# Patient Record
Sex: Female | Born: 1975 | Race: White | Hispanic: No | Marital: Single | Smoking: Never smoker
Health system: Southern US, Community
[De-identification: ages and names within clinical notes are randomized; demographics above are authoritative.]

## PROBLEM LIST (undated history)

## (undated) DIAGNOSIS — Z87442 Personal history of urinary calculi: Secondary | ICD-10-CM

## (undated) DIAGNOSIS — J342 Deviated nasal septum: Secondary | ICD-10-CM

## (undated) DIAGNOSIS — R569 Unspecified convulsions: Secondary | ICD-10-CM

## (undated) DIAGNOSIS — J329 Chronic sinusitis, unspecified: Secondary | ICD-10-CM

## (undated) HISTORY — PX: WISDOM TOOTH EXTRACTION: SHX21

## (undated) HISTORY — DX: Unspecified convulsions: R56.9

---

## 1999-06-28 ENCOUNTER — Other Ambulatory Visit: Admission: RE | Admit: 1999-06-28 | Discharge: 1999-06-28 | Payer: Self-pay | Admitting: Family Medicine

## 2000-12-04 ENCOUNTER — Other Ambulatory Visit: Admission: RE | Admit: 2000-12-04 | Discharge: 2000-12-04 | Payer: Self-pay | Admitting: Family Medicine

## 2001-12-17 ENCOUNTER — Other Ambulatory Visit: Admission: RE | Admit: 2001-12-17 | Discharge: 2001-12-17 | Payer: Self-pay | Admitting: Family Medicine

## 2002-12-25 ENCOUNTER — Encounter: Admission: RE | Admit: 2002-12-25 | Discharge: 2002-12-25 | Payer: Self-pay | Admitting: Family Medicine

## 2002-12-25 ENCOUNTER — Encounter: Payer: Self-pay | Admitting: Family Medicine

## 2004-02-04 ENCOUNTER — Other Ambulatory Visit: Admission: RE | Admit: 2004-02-04 | Discharge: 2004-02-04 | Payer: Self-pay | Admitting: Unknown Physician Specialty

## 2004-11-16 ENCOUNTER — Encounter: Admission: RE | Admit: 2004-11-16 | Discharge: 2004-11-16 | Payer: Self-pay | Admitting: Family Medicine

## 2005-02-28 ENCOUNTER — Other Ambulatory Visit: Admission: RE | Admit: 2005-02-28 | Discharge: 2005-02-28 | Payer: Self-pay | Admitting: Family Medicine

## 2006-02-21 ENCOUNTER — Other Ambulatory Visit: Admission: RE | Admit: 2006-02-21 | Discharge: 2006-02-21 | Payer: Self-pay | Admitting: Family Medicine

## 2008-06-21 ENCOUNTER — Other Ambulatory Visit: Admission: RE | Admit: 2008-06-21 | Discharge: 2008-06-21 | Payer: Self-pay | Admitting: Family Medicine

## 2008-06-28 ENCOUNTER — Encounter: Admission: RE | Admit: 2008-06-28 | Discharge: 2008-06-28 | Payer: Self-pay | Admitting: Family Medicine

## 2009-11-08 ENCOUNTER — Other Ambulatory Visit: Admission: RE | Admit: 2009-11-08 | Discharge: 2009-11-08 | Payer: Self-pay | Admitting: Family Medicine

## 2012-07-25 ENCOUNTER — Other Ambulatory Visit: Payer: Self-pay | Admitting: Family Medicine

## 2012-07-25 ENCOUNTER — Ambulatory Visit
Admission: RE | Admit: 2012-07-25 | Discharge: 2012-07-25 | Disposition: A | Discharge status: Home / Self Care | Payer: BC Managed Care – PPO | Source: Ambulatory Visit | Attending: Family Medicine | Admitting: Family Medicine

## 2012-07-25 DIAGNOSIS — R109 Unspecified abdominal pain: Secondary | ICD-10-CM

## 2012-08-01 ENCOUNTER — Other Ambulatory Visit (HOSPITAL_COMMUNITY): Payer: Self-pay | Admitting: Urology

## 2012-08-01 DIAGNOSIS — R109 Unspecified abdominal pain: Secondary | ICD-10-CM

## 2012-08-01 DIAGNOSIS — N1330 Unspecified hydronephrosis: Secondary | ICD-10-CM

## 2012-08-08 ENCOUNTER — Encounter (HOSPITAL_COMMUNITY): Payer: Self-pay

## 2012-08-08 ENCOUNTER — Encounter (HOSPITAL_COMMUNITY)
Admission: RE | Admit: 2012-08-08 | Discharge: 2012-08-08 | Disposition: A | Discharge status: Home / Self Care | Payer: BC Managed Care – PPO | Source: Ambulatory Visit | Attending: Urology | Admitting: Urology

## 2012-08-08 DIAGNOSIS — N1330 Unspecified hydronephrosis: Secondary | ICD-10-CM

## 2012-08-08 DIAGNOSIS — R109 Unspecified abdominal pain: Secondary | ICD-10-CM | POA: Insufficient documentation

## 2012-08-08 MED ORDER — FUROSEMIDE 10 MG/ML IJ SOLN
54.0000 mg | Freq: Once | INTRAMUSCULAR | Status: AC
  Administered 2012-08-08: 54 mg via INTRAVENOUS
  Filled 2012-08-08: qty 4

## 2012-08-08 MED ORDER — TECHNETIUM TC 99M MERTIATIDE
15.0000 | Freq: Once | INTRAVENOUS | Status: AC | PRN
  Administered 2012-08-08: 15 via INTRAVENOUS

## 2012-11-17 ENCOUNTER — Other Ambulatory Visit (HOSPITAL_COMMUNITY)
Admission: RE | Admit: 2012-11-17 | Discharge: 2012-11-17 | Disposition: A | Discharge status: Home / Self Care | Payer: BC Managed Care – PPO | Source: Ambulatory Visit | Attending: Family Medicine | Admitting: Family Medicine

## 2012-11-17 ENCOUNTER — Other Ambulatory Visit: Payer: Self-pay | Admitting: Family Medicine

## 2012-11-17 DIAGNOSIS — Z01419 Encounter for gynecological examination (general) (routine) without abnormal findings: Secondary | ICD-10-CM | POA: Insufficient documentation

## 2012-11-17 DIAGNOSIS — Z1151 Encounter for screening for human papillomavirus (HPV): Secondary | ICD-10-CM | POA: Insufficient documentation

## 2013-01-12 ENCOUNTER — Other Ambulatory Visit: Payer: Self-pay | Admitting: Family Medicine

## 2013-07-17 ENCOUNTER — Other Ambulatory Visit (HOSPITAL_COMMUNITY): Payer: Self-pay | Admitting: Urology

## 2013-07-17 DIAGNOSIS — N1330 Unspecified hydronephrosis: Secondary | ICD-10-CM

## 2013-09-04 ENCOUNTER — Ambulatory Visit (HOSPITAL_COMMUNITY)
Admission: RE | Admit: 2013-09-04 | Discharge: 2013-09-04 | Disposition: A | Discharge status: Home / Self Care | Payer: BC Managed Care – PPO | Source: Ambulatory Visit | Attending: Urology | Admitting: Urology

## 2013-09-04 DIAGNOSIS — N259 Disorder resulting from impaired renal tubular function, unspecified: Secondary | ICD-10-CM | POA: Insufficient documentation

## 2013-09-04 DIAGNOSIS — N1330 Unspecified hydronephrosis: Secondary | ICD-10-CM

## 2013-09-04 MED ORDER — FUROSEMIDE 10 MG/ML IJ SOLN
54.0000 mg | Freq: Once | INTRAMUSCULAR | Status: AC
  Administered 2013-09-04: 54 mg via INTRAVENOUS
  Filled 2013-09-04: qty 6

## 2013-09-04 MED ORDER — TECHNETIUM TC 99M MERTIATIDE
15.7000 | Freq: Once | INTRAVENOUS | Status: AC | PRN
  Administered 2013-09-04: 16 via INTRAVENOUS

## 2013-12-15 ENCOUNTER — Telehealth: Payer: Self-pay | Admitting: Hematology and Oncology

## 2013-12-15 NOTE — Telephone Encounter (Signed)
Left message for patient and gave np appt for 08/25 @ 1:30 w/Dr. Bertis RuddyGorsuch.  Referring Dr.  Gweneth DimitriWendy [name] Dx- Leukocytosis.

## 2013-12-22 ENCOUNTER — Encounter: Payer: Self-pay | Admitting: Hematology and Oncology

## 2013-12-22 ENCOUNTER — Encounter (INDEPENDENT_AMBULATORY_CARE_PROVIDER_SITE_OTHER): Payer: Self-pay

## 2013-12-22 ENCOUNTER — Ambulatory Visit (HOSPITAL_BASED_OUTPATIENT_CLINIC_OR_DEPARTMENT_OTHER): Payer: BC Managed Care – PPO | Admitting: Hematology and Oncology

## 2013-12-22 ENCOUNTER — Telehealth: Payer: Self-pay | Admitting: Hematology and Oncology

## 2013-12-22 ENCOUNTER — Ambulatory Visit: Payer: BC Managed Care – PPO

## 2013-12-22 VITALS — BP 155/104 | HR 140 | Temp 98.7°F | Resp 20 | Ht 67.0 in | Wt 237.1 lb

## 2013-12-22 DIAGNOSIS — D72829 Elevated white blood cell count, unspecified: Secondary | ICD-10-CM

## 2013-12-22 DIAGNOSIS — B372 Candidiasis of skin and nail: Secondary | ICD-10-CM

## 2013-12-22 NOTE — Progress Notes (Signed)
Checked in new pt with no financial concerns. °

## 2013-12-22 NOTE — Telephone Encounter (Signed)
Pt confirmed labs/ov per 08/25 POF, gave pt AVS....KJ °

## 2013-12-22 NOTE — Assessment & Plan Note (Signed)
The cause of her leukocytosis is likely reactive in nature, related to recurrent skin infection. I recommend she continues her current treatment and I will make her a return appointment in 1 month with repeat blood work, history and physical examination. She agreed with the plan.

## 2013-12-22 NOTE — Progress Notes (Signed)
[facility] CONSULT NOTE  Patient Care Team: Gweneth Dimitri, MD as PCP - General (Family Medicine) Artis Delay, MD as Consulting Physician (Hematology and Oncology)  CHIEF COMPLAINTS/PURPOSE OF CONSULTATION:  Chronic leukocytosis  HISTORY OF PRESENTING ILLNESS:  Jamie Knapp 38 y.o. female is here because of elevated WBC.  She was found to have abnormal CBC from routine blood work monitoring by her PCP. The patient had history of seizure disorder and is on close monitoring with blood work for this. Review of CBC from July 2014 to August 2015 show elevated white blood cell count ranging from 11.9-14. She has recurrent skin infection. She started taking topical antifungal cream for yeast infection. The last prescription antibiotics was more than 3 months ago There is not reported symptoms of sinus congestion, cough, urinary frequency/urgency or dysuria, diarrhea or joint swelling/pain. She had no prior history or diagnosis of cancer. Her age appropriate screening programs are up-to-date. The patient has no prior diagnosis of autoimmune disease and was not prescribed corticosteroids related products.  MEDICAL HISTORY:  Past Medical History  Diagnosis Date  . Seizures   . Leukocytosis, unspecified 12/22/2013    SURGICAL HISTORY: History reviewed. No pertinent past surgical history.  SOCIAL HISTORY: History   Social History  . Marital Status: Single    Spouse Name: N/A    Number of Children: N/A  . Years of Education: N/A   Occupational History  . Not on file.   Social History Main Topics  . Smoking status: Never Smoker   . Smokeless tobacco: Never Used  . Alcohol Use: No  . Drug Use: No  . Sexual Activity: Not on file   Other Topics Concern  . Not on file   Social History Narrative  . No narrative on file    FAMILY HISTORY: Family History  Problem Relation Age of Onset  . Cancer Paternal Grandmother     female cancer    ALLERGIES:  has No Known  Allergies.  MEDICATIONS:  Current Outpatient Prescriptions  Medication Sig Dispense Refill  . levETIRAcetam (KEPPRA) 250 MG tablet       . nystatin cream (MYCOSTATIN)       . VIORELE 0.15-0.02/0.01 MG (21/5) tablet       . zonisamide (ZONEGRAN) 100 MG capsule        No current facility-administered medications for this visit.    REVIEW OF SYSTEMS:   Constitutional: Denies fevers, chills or abnormal night sweats Eyes: Denies blurriness of vision, double vision or watery eyes Ears, nose, mouth, throat, and face: Denies mucositis or sore throat Respiratory: Denies cough, dyspnea or wheezes Cardiovascular: Denies palpitation, chest discomfort or lower extremity swelling Gastrointestinal:  Denies nausea, heartburn or change in bowel habits Lymphatics: Denies new lymphadenopathy or easy bruising Neurological:Denies numbness, tingling or new weaknesses Behavioral/Psych: Mood is stable, no new changes  All other systems were reviewed with the patient and are negative.  PHYSICAL EXAMINATION: ECOG PERFORMANCE STATUS: 0 - Asymptomatic  Filed Vitals:   12/22/13 1340  BP: 155/104  Pulse: 140  Temp: 98.7 F (37.1 C)  Resp: 20   Filed Weights   12/22/13 1340  Weight: 237 lb 1.6 oz (107.548 kg)    GENERAL:alert, no distress and comfortable. She is morbidly obese SKIN: Noted yeast infection on her groin. EYES: normal, conjunctiva are pink and non-injected, sclera clear OROPHARYNX:no exudate, no erythema and lips, buccal mucosa, and tongue normal  NECK: supple, thyroid normal size, non-tender, without nodularity LYMPH:  no palpable  lymphadenopathy in the cervical, axillary or inguinal LUNGS: clear to auscultation and percussion with normal breathing effort HEART: regular rate & rhythm and no murmurs and no lower extremity edema ABDOMEN:abdomen soft, non-tender and normal bowel sounds Musculoskeletal:no cyanosis of digits and no clubbing  PSYCH: alert & oriented x 3 with fluent  speech NEURO: no focal motor/sensory deficits  LABORATORY DATA:  I have reviewed the data as listed ASSESSMENT & PLAN Leukocytosis, unspecified The cause of her leukocytosis is likely reactive in nature, related to recurrent skin infection. I recommend she continues her current treatment and I will make her a return appointment in 1 month with repeat blood work, history and physical examination. She agreed with the plan.  Yeast infection of the skin She is using topical antifungal cream for this. I recommend she continues.

## 2013-12-22 NOTE — Assessment & Plan Note (Signed)
She is using topical antifungal cream for this. I recommend she continues.   

## 2014-01-14 ENCOUNTER — Ambulatory Visit (HOSPITAL_BASED_OUTPATIENT_CLINIC_OR_DEPARTMENT_OTHER): Payer: BC Managed Care – PPO | Admitting: Hematology and Oncology

## 2014-01-14 ENCOUNTER — Encounter: Payer: Self-pay | Admitting: Hematology and Oncology

## 2014-01-14 ENCOUNTER — Other Ambulatory Visit (HOSPITAL_BASED_OUTPATIENT_CLINIC_OR_DEPARTMENT_OTHER): Payer: BC Managed Care – PPO

## 2014-01-14 ENCOUNTER — Telehealth: Payer: Self-pay | Admitting: Hematology and Oncology

## 2014-01-14 VITALS — BP 146/93 | HR 107 | Temp 98.2°F | Resp 21 | Ht 67.0 in | Wt 237.4 lb

## 2014-01-14 DIAGNOSIS — B372 Candidiasis of skin and nail: Secondary | ICD-10-CM

## 2014-01-14 DIAGNOSIS — D72829 Elevated white blood cell count, unspecified: Secondary | ICD-10-CM

## 2014-01-14 LAB — CBC WITH DIFFERENTIAL/PLATELET
BASO%: 0.4 % (ref 0.0–2.0)
BASOS ABS: 0.1 10*3/uL (ref 0.0–0.1)
EOS ABS: 0.2 10*3/uL (ref 0.0–0.5)
EOS%: 1.8 % (ref 0.0–7.0)
HEMATOCRIT: 41.4 % (ref 34.8–46.6)
HEMOGLOBIN: 14.2 g/dL (ref 11.6–15.9)
LYMPH%: 34.2 % (ref 14.0–49.7)
MCH: 30 pg (ref 25.1–34.0)
MCHC: 34.3 g/dL (ref 31.5–36.0)
MCV: 87.3 fL (ref 79.5–101.0)
MONO#: 0.5 10*3/uL (ref 0.1–0.9)
MONO%: 4.4 % (ref 0.0–14.0)
NEUT%: 59.2 % (ref 38.4–76.8)
NEUTROS ABS: 6.9 10*3/uL — AB (ref 1.5–6.5)
NRBC: 0 % (ref 0–0)
PLATELETS: 265 10*3/uL (ref 145–400)
RBC: 4.74 10*6/uL (ref 3.70–5.45)
RDW: 13.1 % (ref 11.2–14.5)
WBC: 11.7 10*3/uL — AB (ref 3.9–10.3)
lymph#: 4 10*3/uL — ABNORMAL HIGH (ref 0.9–3.3)

## 2014-01-14 LAB — MORPHOLOGY
PLT EST: ADEQUATE
RBC Comments: NORMAL

## 2014-01-14 LAB — URINALYSIS, MICROSCOPIC - CHCC
BILIRUBIN (URINE): NEGATIVE
Blood: NEGATIVE
GLUCOSE UR CHCC: NEGATIVE mg/dL
Ketones: NEGATIVE mg/dL
Leukocyte Esterase: NEGATIVE
NITRITE: NEGATIVE
Protein: NEGATIVE mg/dL
SPECIFIC GRAVITY, URINE: 1.025 (ref 1.003–1.035)
UROBILINOGEN UR: 0.2 mg/dL (ref 0.2–1)
pH: 6 (ref 4.6–8.0)

## 2014-01-14 LAB — CHCC SMEAR

## 2014-01-14 NOTE — Assessment & Plan Note (Signed)
Her white blood cell count is improving. I suspect this is reactive in nature. Obesity can cause a leukocytosis as well. The patient appeared to be very concerned. I tried to reassure her that this is benign. Plan to see her back in one year with repeat blood work and followup.

## 2014-01-14 NOTE — Progress Notes (Signed)
[facility] OFFICE PROGRESS NOTE  [name],[name], MD SUMMARY OF HEMATOLOGIC HISTORY:  She was found to have abnormal CBC from routine blood work monitoring by her PCP. The patient had history of seizure disorder and is on close monitoring with blood work for this. Review of CBC from July 2014 to August 2015 show elevated white blood cell count ranging from 11.9-14. She has recurrent skin infection. She started taking topical antifungal cream for yeast infection. The last prescription antibiotics was more than 3 months ago INTERVAL HISTORY: Jamie Knapp 38 y.o. female returns for further followup. She feels well.  I have reviewed the past medical history, past surgical history, social history and family history with the patient and they are unchanged from previous note.  ALLERGIES:  is allergic to bee venom.  MEDICATIONS:  Current Outpatient Prescriptions  Medication Sig Dispense Refill  . levETIRAcetam (KEPPRA) 250 MG tablet       . nystatin cream (MYCOSTATIN)       . VIORELE 0.15-0.02/0.01 MG (21/5) tablet       . zonisamide (ZONEGRAN) 100 MG capsule        No current facility-administered medications for this visit.     REVIEW OF SYSTEMS:   All other systems were reviewed with the patient and are negative.  PHYSICAL EXAMINATION: ECOG PERFORMANCE STATUS: 0 - Asymptomatic  Filed Vitals:   01/14/14 1535  BP: 146/93  Pulse: 107  Temp: 98.2 F (36.8 C)  Resp: 21   Filed Weights   01/14/14 1535  Weight: 237 lb 6.4 oz (107.684 kg)    GENERAL:alert, no distress and comfortable. She is morbidly obese SKIN: skin color, texture, turgor are normal, no rashes or significant lesions EYES: normal, Conjunctiva are pink and non-injected, sclera clear Musculoskeletal:no cyanosis of digits and no clubbing  NEURO: alert & oriented x 3 with fluent speech, no focal motor/sensory deficits  LABORATORY DATA:  I have reviewed the data as listed Results for orders placed  in visit on 01/14/14 (from the past 48 hour(s))  CBC WITH DIFFERENTIAL     Status: Abnormal   Collection Time    01/14/14  3:22 PM      Result Value Ref Range   WBC 11.7 (*) 3.9 - 10.3 10e3/uL   NEUT# 6.9 (*) 1.5 - 6.5 10e3/uL   HGB 14.2  11.6 - 15.9 g/dL   HCT 40.9  81.1 - 91.4 %   Platelets 265  145 - 400 10e3/uL   MCV 87.3  79.5 - 101.0 fL   MCH 30.0  25.1 - 34.0 pg   MCHC 34.3  31.5 - 36.0 g/dL   RBC 7.82  9.56 - 2.13 10e6/uL   RDW 13.1  11.2 - 14.5 %   lymph# 4.0 (*) 0.9 - 3.3 10e3/uL   MONO# 0.5  0.1 - 0.9 10e3/uL   Eosinophils Absolute 0.2  0.0 - 0.5 10e3/uL   Basophils Absolute 0.1  0.0 - 0.1 10e3/uL   NEUT% 59.2  38.4 - 76.8 %   LYMPH% 34.2  14.0 - 49.7 %   MONO% 4.4  0.0 - 14.0 %   EOS% 1.8  0.0 - 7.0 %   BASO% 0.4  0.0 - 2.0 %   nRBC 0  0 - 0 %  MORPHOLOGY     Status: None   Collection Time    01/14/14  3:22 PM      Result Value Ref Range   RBC Comments Within Normal Limits  Within Normal  Limits   White Cell Comments few Variant Lymphs     PLT EST Adequate  Adequate  [facility] SMEAR     Status: None   Collection Time    01/14/14  3:22 PM      Result Value Ref Range   Smear Result Smear Available    URINALYSIS, MICROSCOPIC - [facility]     Status: None   Collection Time    01/14/14  3:23 PM      Result Value Ref Range   Glucose Negative  Negative mg/dL   Bilirubin (Urine) Negative  Negative   Ketones Negative  Negative mg/dL   Specific Gravity, Urine 1.025  1.003 - 1.035   Blood Negative  Negative   pH 6.0  4.6 - 8.0   Protein Negative  Negative- <30 mg/dL   Urobilinogen, UR 0.2  0.2 - 1 mg/dL   Nitrite Negative  Negative   Leukocyte Esterase Negative  Negative   RBC / HPF 0-2  0 - 2   WBC, UA 0-2  0 - 2   Bacteria, UA Moderate  Negative- Trace   Crystals Ca Oxalate  Negative   Epithelial Cells Few  Negative- Few   Mucus, UA Moderate  Negative- Small    Lab Results  Component Value Date   WBC 11.7* 01/14/2014   HGB 14.2 01/14/2014   HCT 41.4 01/14/2014   MCV  87.3 01/14/2014   PLT 265 01/14/2014   I have reviewed her peripheral smear. It looks benign. ASSESSMENT & PLAN:  Leukocytosis, unspecified Her white blood cell count is improving. I suspect this is reactive in nature. Obesity can cause a leukocytosis as well. The patient appeared to be very concerned. I tried to reassure her that this is benign. Plan to see her back in one year with repeat blood work and followup.  Yeast infection of the skin She is using topical antifungal cream for this. I recommend she continues.      All questions were answered. The patient knows to call the clinic with any problems, questions or concerns. No barriers to learning was detected.  I spent 15 minutes counseling the patient face to face. The total time spent in the appointment was 20 minutes and more than 50% was on counseling.     [facility], Rateel Beldin, MD 01/14/2014 8:49 PM

## 2014-01-14 NOTE — Telephone Encounter (Signed)
gv adn printed appt sched and avs for pt for May °

## 2014-01-14 NOTE — Assessment & Plan Note (Signed)
She is using topical antifungal cream for this. I recommend she continues.   

## 2014-01-15 LAB — SEDIMENTATION RATE: SED RATE: 4 mm/h (ref 0–22)

## 2014-02-12 ENCOUNTER — Other Ambulatory Visit: Payer: Self-pay

## 2014-03-16 ENCOUNTER — Ambulatory Visit: Payer: BC Managed Care – PPO | Admitting: Hematology and Oncology

## 2014-09-14 ENCOUNTER — Other Ambulatory Visit (HOSPITAL_BASED_OUTPATIENT_CLINIC_OR_DEPARTMENT_OTHER): Payer: BC Managed Care – PPO

## 2014-09-14 ENCOUNTER — Ambulatory Visit (HOSPITAL_BASED_OUTPATIENT_CLINIC_OR_DEPARTMENT_OTHER): Payer: BC Managed Care – PPO | Admitting: Hematology and Oncology

## 2014-09-14 ENCOUNTER — Encounter: Payer: Self-pay | Admitting: Hematology and Oncology

## 2014-09-14 VITALS — BP 138/98 | HR 91 | Temp 98.9°F | Resp 18 | Ht 67.0 in | Wt 238.2 lb

## 2014-09-14 DIAGNOSIS — B372 Candidiasis of skin and nail: Secondary | ICD-10-CM

## 2014-09-14 DIAGNOSIS — D72829 Elevated white blood cell count, unspecified: Secondary | ICD-10-CM

## 2014-09-14 LAB — CBC WITH DIFFERENTIAL/PLATELET
BASO%: 0.4 % (ref 0.0–2.0)
BASOS ABS: 0.1 10*3/uL (ref 0.0–0.1)
EOS%: 2.7 % (ref 0.0–7.0)
Eosinophils Absolute: 0.3 10*3/uL (ref 0.0–0.5)
HCT: 42.6 % (ref 34.8–46.6)
HGB: 14.4 g/dL (ref 11.6–15.9)
LYMPH#: 4.6 10*3/uL — AB (ref 0.9–3.3)
LYMPH%: 36.5 % (ref 14.0–49.7)
MCH: 29.8 pg (ref 25.1–34.0)
MCHC: 33.8 g/dL (ref 31.5–36.0)
MCV: 88.2 fL (ref 79.5–101.0)
MONO#: 0.6 10*3/uL (ref 0.1–0.9)
MONO%: 4.5 % (ref 0.0–14.0)
NEUT#: 7.1 10*3/uL — ABNORMAL HIGH (ref 1.5–6.5)
NEUT%: 55.9 % (ref 38.4–76.8)
PLATELETS: 227 10*3/uL (ref 145–400)
RBC: 4.83 10*6/uL (ref 3.70–5.45)
RDW: 13.6 % (ref 11.2–14.5)
WBC: 12.7 10*3/uL — ABNORMAL HIGH (ref 3.9–10.3)

## 2014-09-14 LAB — SEDIMENTATION RATE: Sed Rate: 14 mm/hr (ref 0–20)

## 2014-09-14 LAB — CHCC SMEAR

## 2014-09-14 NOTE — Progress Notes (Signed)
[facility] OFFICE PROGRESS NOTE  [name],[name], MD SUMMARY OF HEMATOLOGIC HISTORY:  She was found to have abnormal CBC from routine blood work monitoring by her PCP. The patient had history of seizure disorder and is on close monitoring with blood work for this. Review of CBC from July 2014 to August 2015 show elevated white blood cell count ranging from 11.9-14. She has recurrent skin infection. She started taking topical antifungal cream for yeast infection. She was observed.  INTERVAL HISTORY: Jamie Knapp 39 y.o. female returns for further follow-up. She have diagnosis of influenza infection in April 2016, treated appropriately. She has some allergic rhinitis symptoms with some nasal drainage. Denies sore throat, fevers or cough. She have recurrent skin infection underneath the left breast and in her groin regions. She put some topical powder to dry those areas.  I have reviewed the past medical history, past surgical history, social history and family history with the patient and they are unchanged from previous note.  ALLERGIES:  is allergic to bee venom.  MEDICATIONS:  Current Outpatient Prescriptions  Medication Sig Dispense Refill  . levETIRAcetam (KEPPRA) 250 MG tablet     . nystatin cream (MYCOSTATIN)     . VIORELE 0.15-0.02/0.01 MG (21/5) tablet     . zonisamide (ZONEGRAN) 100 MG capsule      No current facility-administered medications for this visit.     REVIEW OF SYSTEMS:   Constitutional: Denies fevers, chills or night sweats Eyes: Denies blurriness of vision Respiratory: Denies cough, dyspnea or wheezes Cardiovascular: Denies palpitation, chest discomfort or lower extremity swelling Gastrointestinal:  Denies nausea, heartburn or change in bowel habits Lymphatics: Denies new lymphadenopathy or easy bruising Neurological:Denies numbness, tingling or new weaknesses Behavioral/Psych: Mood is stable, no new changes  All other systems were reviewed  with the patient and are negative.  PHYSICAL EXAMINATION: ECOG PERFORMANCE STATUS: 1 - Symptomatic but completely ambulatory  Filed Vitals:   09/14/14 1515  BP: 138/98  Pulse: 91  Temp: 98.9 F (37.2 C)  Resp: 18   Filed Weights   09/14/14 1515  Weight: 238 lb 3.2 oz (108.047 kg)    GENERAL:alert, no distress and comfortable. She is obese  SKIN: Noted mild erythematous skin rash under the left breast and the bilateral groin region. There is no evidence of cellulitis. EYES: normal, Conjunctiva are pink and non-injected, sclera clear OROPHARYNX:no exudate, no erythema and lips, buccal mucosa, and tongue normal  NECK: supple, thyroid normal size, non-tender, without nodularity LYMPH:  no palpable lymphadenopathy in the cervical, axillary or inguinal LUNGS: clear to auscultation and percussion with normal breathing effort HEART: regular rate & rhythm and no murmurs and no lower extremity edema ABDOMEN:abdomen soft, non-tender and normal bowel sounds Musculoskeletal:no cyanosis of digits and no clubbing  NEURO: alert & oriented x 3 with fluent speech, no focal motor/sensory deficits  LABORATORY DATA:  I have reviewed the data as listed Results for orders placed or performed in visit on 09/14/14 (from the past 48 hour(s))  CBC with Differential     Status: Abnormal   Collection Time: 09/14/14  2:55 PM  Result Value Ref Range   WBC 12.7 (H) 3.9 - 10.3 10e3/uL   NEUT# 7.1 (H) 1.5 - 6.5 10e3/uL   HGB 14.4 11.6 - 15.9 g/dL   HCT 16.142.6 09.634.8 - 04.546.6 %   Platelets 227 145 - 400 10e3/uL   MCV 88.2 79.5 - 101.0 fL   MCH 29.8 25.1 - 34.0 pg   MCHC  33.8 31.5 - 36.0 g/dL   RBC 1.614.83 0.963.70 - 0.455.45 10e6/uL   RDW 13.6 11.2 - 14.5 %   lymph# 4.6 (H) 0.9 - 3.3 10e3/uL   MONO# 0.6 0.1 - 0.9 10e3/uL   Eosinophils Absolute 0.3 0.0 - 0.5 10e3/uL   Basophils Absolute 0.1 0.0 - 0.1 10e3/uL   NEUT% 55.9 38.4 - 76.8 %   LYMPH% 36.5 14.0 - 49.7 %   MONO% 4.5 0.0 - 14.0 %   EOS% 2.7 0.0 - 7.0 %    BASO% 0.4 0.0 - 2.0 %  Smear     Status: None   Collection Time: 09/14/14  2:55 PM  Result Value Ref Range   Smear Result Smear Available     Lab Results  Component Value Date   WBC 12.7* 09/14/2014   HGB 14.4 09/14/2014   HCT 42.6 09/14/2014   MCV 88.2 09/14/2014   PLT 227 09/14/2014   ASSESSMENT & PLAN:  Leukocytosis She has chronic leukocytosis, likely reactive in nature. She recently recover from flulike illness and now have allergic rhinitis symptoms. She also have recurrent skin infection under her breasts and around the skin fold on both groin areas. Unless her symptoms of infection resolve, her white blood cell count is unlikely going to return back to normal. Overall, the patient does not look ill. I recommend follow-up with primary care provider only. If her recurrent blood tests in the future continue to rise over 20,000 despite repeat blood work, I will see her back.   Yeast infection of the skin She is using topical antifungal cream for this. I recommend she continues.      All questions were answered. The patient knows to call the clinic with any problems, questions or concerns. No barriers to learning was detected.  I spent 15 minutes counseling the patient face to face. The total time spent in the appointment was 20 minutes and more than 50% was on counseling.     [facility], Katiana Ruland, MD 5/17/20163:32 PM

## 2014-09-14 NOTE — Assessment & Plan Note (Signed)
She is using topical antifungal cream for this. I recommend she continues.

## 2014-09-14 NOTE — Assessment & Plan Note (Signed)
She has chronic leukocytosis, likely reactive in nature. She recently recover from flulike illness and now have allergic rhinitis symptoms. She also have recurrent skin infection under her breasts and around the skin fold on both groin areas. Unless her symptoms of infection resolve, her white blood cell count is unlikely going to return back to normal. Overall, the patient does not look ill. I recommend follow-up with primary care provider only. If her recurrent blood tests in the future continue to rise over 20,000 despite repeat blood work, I will see her back.

## 2014-09-29 DIAGNOSIS — Z87442 Personal history of urinary calculi: Secondary | ICD-10-CM

## 2014-09-29 HISTORY — DX: Personal history of urinary calculi: Z87.442

## 2014-10-22 ENCOUNTER — Encounter (HOSPITAL_BASED_OUTPATIENT_CLINIC_OR_DEPARTMENT_OTHER): Payer: Self-pay | Admitting: Emergency Medicine

## 2014-10-22 DIAGNOSIS — Z3202 Encounter for pregnancy test, result negative: Secondary | ICD-10-CM | POA: Insufficient documentation

## 2014-10-22 DIAGNOSIS — N200 Calculus of kidney: Secondary | ICD-10-CM | POA: Diagnosis not present

## 2014-10-22 DIAGNOSIS — Z8742 Personal history of other diseases of the female genital tract: Secondary | ICD-10-CM | POA: Diagnosis not present

## 2014-10-22 DIAGNOSIS — Z79899 Other long term (current) drug therapy: Secondary | ICD-10-CM | POA: Insufficient documentation

## 2014-10-22 DIAGNOSIS — R109 Unspecified abdominal pain: Secondary | ICD-10-CM | POA: Diagnosis present

## 2014-10-22 DIAGNOSIS — Z862 Personal history of diseases of the blood and blood-forming organs and certain disorders involving the immune mechanism: Secondary | ICD-10-CM | POA: Diagnosis not present

## 2014-10-22 LAB — URINALYSIS, ROUTINE W REFLEX MICROSCOPIC
Bilirubin Urine: NEGATIVE
Glucose, UA: NEGATIVE mg/dL
Hgb urine dipstick: NEGATIVE
KETONES UR: NEGATIVE mg/dL
LEUKOCYTES UA: NEGATIVE
Nitrite: NEGATIVE
PROTEIN: NEGATIVE mg/dL
Specific Gravity, Urine: 1.026 (ref 1.005–1.030)
UROBILINOGEN UA: 0.2 mg/dL (ref 0.0–1.0)
pH: 5.5 (ref 5.0–8.0)

## 2014-10-22 LAB — PREGNANCY, URINE: PREG TEST UR: NEGATIVE

## 2014-10-22 NOTE — ED Notes (Signed)
Went to UC this morning for UTI sx.  Rx for Nitrofurantoin.  Severe left flank pain started at 1930 tonight.  Pt sts she is now having trouble urinating.

## 2014-10-23 ENCOUNTER — Emergency Department (HOSPITAL_BASED_OUTPATIENT_CLINIC_OR_DEPARTMENT_OTHER)
Admission: EM | Admit: 2014-10-23 | Discharge: 2014-10-23 | Disposition: A | Discharge status: Home / Self Care | Payer: BC Managed Care – PPO | Attending: Emergency Medicine | Admitting: Emergency Medicine

## 2014-10-23 ENCOUNTER — Emergency Department (HOSPITAL_BASED_OUTPATIENT_CLINIC_OR_DEPARTMENT_OTHER): Payer: BC Managed Care – PPO

## 2014-10-23 ENCOUNTER — Encounter (HOSPITAL_BASED_OUTPATIENT_CLINIC_OR_DEPARTMENT_OTHER): Payer: Self-pay | Admitting: Emergency Medicine

## 2014-10-23 DIAGNOSIS — N200 Calculus of kidney: Secondary | ICD-10-CM

## 2014-10-23 DIAGNOSIS — R52 Pain, unspecified: Secondary | ICD-10-CM

## 2014-10-23 MED ORDER — TAMSULOSIN HCL 0.4 MG PO CAPS
0.4000 mg | ORAL_CAPSULE | Freq: Every day | ORAL | Status: DC
  Administered 2014-10-23: 0.4 mg via ORAL
  Filled 2014-10-23: qty 1

## 2014-10-23 MED ORDER — METHOCARBAMOL 500 MG PO TABS
1000.0000 mg | ORAL_TABLET | Freq: Once | ORAL | Status: AC
  Administered 2014-10-23: 1000 mg via ORAL
  Filled 2014-10-23: qty 2

## 2014-10-23 MED ORDER — IBUPROFEN 800 MG PO TABS: 800.0000 mg | ORAL_TABLET | Freq: Three times a day (TID) | ORAL | Status: DC

## 2014-10-23 MED ORDER — KETOROLAC TROMETHAMINE 60 MG/2ML IM SOLN
60.0000 mg | Freq: Once | INTRAMUSCULAR | Status: AC
  Administered 2014-10-23: 60 mg via INTRAMUSCULAR
  Filled 2014-10-23: qty 2

## 2014-10-23 MED ORDER — ONDANSETRON 8 MG PO TBDP: ORAL_TABLET | ORAL | Status: DC

## 2014-10-23 MED ORDER — TAMSULOSIN HCL 0.4 MG PO CAPS: 0.4000 mg | ORAL_CAPSULE | Freq: Every day | ORAL | Status: DC

## 2014-10-23 MED ORDER — OXYCODONE-ACETAMINOPHEN 5-325 MG PO TABS
1.0000 | ORAL_TABLET | Freq: Four times a day (QID) | ORAL | Status: DC | PRN
Start: 2014-10-23 — End: 2015-11-03

## 2014-10-23 MED ORDER — OXYCODONE-ACETAMINOPHEN 5-325 MG PO TABS
1.0000 | ORAL_TABLET | Freq: Once | ORAL | Status: AC
  Administered 2014-10-23: 1 via ORAL
  Filled 2014-10-23: qty 1

## 2014-10-23 MED ORDER — ONDANSETRON 8 MG PO TBDP
8.0000 mg | ORAL_TABLET | Freq: Once | ORAL | Status: AC
Start: 2014-10-23 — End: 2014-10-23
  Administered 2014-10-23: 8 mg via ORAL
  Filled 2014-10-23: qty 1

## 2014-10-23 NOTE — ED Provider Notes (Signed)
CSN: [id number]     Arrival date & time 10/22/14  2323 History   First MD Initiated Contact with Patient 10/23/14 0056     Chief Complaint  Patient presents with  . Flank Pain     (Consider location/radiation/quality/duration/timing/severity/associated sxs/prior Treatment) Patient is a 39 y.o. female presenting with flank pain. The history is provided by the patient.  Flank Pain This is a new problem. The current episode started more than 2 days ago. The problem occurs constantly. The problem has not changed since onset.Pertinent negatives include no chest pain, no abdominal pain, no headaches and no shortness of breath. Nothing aggravates the symptoms. Nothing relieves the symptoms. Treatments tried: antibiotic. The treatment provided no relief.  pain is over the left posterior iliac crest  Past Medical History  Diagnosis Date  . Seizures   . Leukocytosis, unspecified 12/22/2013  . UPJ (ureteropelvic junction) obstruction    History reviewed. No pertinent past surgical history. Family History  Problem Relation Age of Onset  . Cancer Paternal Grandmother     female cancer   History  Substance Use Topics  . Smoking status: Never Smoker   . Smokeless tobacco: Never Used  . Alcohol Use: No   OB History    No data available     Review of Systems  Respiratory: Negative for shortness of breath.   Cardiovascular: Negative for chest pain.  Gastrointestinal: Positive for nausea and vomiting. Negative for abdominal pain.  Genitourinary: Positive for frequency and flank pain. Negative for pelvic pain.  Neurological: Negative for headaches.  All other systems reviewed and are negative.     Allergies  Bee venom  Home Medications   Prior to Admission medications   Medication Sig Start Date End Date Taking? Authorizing Provider  nitrofurantoin (MACRODANTIN) 100 MG capsule Take 100 mg by mouth 2 (two) times daily.   Yes Historical Provider, MD  ibuprofen (ADVIL,MOTRIN) 800 MG  tablet Take 1 tablet (800 mg total) by mouth 3 (three) times daily. 10/23/14   Corean Yoshimura, MD  levETIRAcetam (KEPPRA) 250 MG tablet  11/24/13   Historical Provider, MD  nystatin cream (MYCOSTATIN)  09/14/13   Historical Provider, MD  ondansetron (ZOFRAN ODT) 8 MG disintegrating tablet 8mg  ODT q8 hours prn nausea 10/23/14   Nga Rabon, MD  oxyCODONE-acetaminophen (PERCOCET) 5-325 MG per tablet Take 1-2 tablets by mouth every 6 (six) hours as needed. 10/23/14   Wava Kildow, MD  tamsulosin (FLOMAX) 0.4 MG CAPS capsule Take 1 capsule (0.4 mg total) by mouth daily. 10/23/14   Agam Davenport, MD  VIORELE 0.15-0.02/0.01 MG (21/5) tablet  12/21/13   Historical Provider, MD  zonisamide (ZONEGRAN) 100 MG capsule  12/15/13   Historical Provider, MD   BP 141/73 mmHg  Pulse 86  Temp(Src) 98.4 F (36.9 C) (Oral)  Resp 18  Ht 5\' 6"  (1.676 m)  Wt 235 lb (106.595 kg)  BMI 37.95 kg/m2  SpO2 99%  LMP 07/28/2014 (Exact Date) Physical Exam  Constitutional: She is oriented to person, place, and time. She appears well-developed and well-nourished.  HENT:  Head: Normocephalic and atraumatic.  Mouth/Throat: Oropharynx is clear and moist.  Eyes: Conjunctivae and EOM are normal. Pupils are equal, round, and reactive to light.  Neck: Normal range of motion. Neck supple.  Cardiovascular: Normal rate, regular rhythm and intact distal pulses.   Pulmonary/Chest: Effort normal and breath sounds normal. No respiratory distress. She has no wheezes. She has no rales.  Abdominal: Soft. Bowel sounds are normal. There is no  tenderness. There is no rebound and no guarding.  Musculoskeletal: Normal range of motion.  Neurological: She is alert and oriented to person, place, and time.  Skin: Skin is warm and dry.    ED Course  Procedures (including critical care time) Labs Review Labs Reviewed  URINALYSIS, ROUTINE W REFLEX [facility]CROSCOPIC [facility] - Abnormal; Notable for the following:    APPearance CLOUDY (*)    All  other components within normal limits  PREGNANCY, URINE    Imaging Review Ct Renal Stone Study  10/23/2014   CLINICAL DATA:  Severe left flank pain, difficulty urinating, history of kidney stones  EXAM: CT ABDOMEN AND PELVIS WITHOUT CONTRAST  TECHNIQUE: Multidetector CT imaging of the abdomen and pelvis was performed following the standard protocol without IV contrast.  COMPARISON:  CT abdomen pelvis dated 03/20 09/06/2012  FINDINGS: Lower chest:  Lung bases are clear.  Hepatobiliary: Moderate hepatic steatosis with focal fatty sparing along the gallbladder fossa.  Gallbladder is unremarkable. No intrahepatic or extrahepatic ductal dilatation.  Pancreas: Within normal limits.  Spleen: Within normal limits.  Adrenals/Urinary Tract: Adrenal glands are within normal limits.  Right kidney is within normal limits.  Left kidney is notable for mild left hydroureteronephrosis.  3 mm distal left ureteral calculus just above the UVJ (series 2/ image 85).  Bladder is underdistended but unremarkable.  Stomach/Bowel: Stomach is within normal limits.  No evidence of bowel obstruction.  Normal appendix.  Vascular/Lymphatic: No evidence of abdominal aortic aneurysm.  No suspicious abdominopelvic lymphadenopathy.  Reproductive: Uterus is within normal limits.  Bilateral ovaries are within normal limits.  Other: No abdominopelvic ascites.  Musculoskeletal: Degenerative changes of the visualized thoracolumbar spine.  IMPRESSION: 3 mm distal left ureteral calculus just above the UVJ. Mild left hydroureteronephrosis.   Electronically Signed   By: Charline Bills M.D.   On: 10/23/2014 02:22     EKG Interpretation None      MDM   Final diagnoses:  Pain  Kidney stone    Pain markedly improved.  Treated for kidney stone.  Parcocet, ibuprofen and flomax and zofran ODT strain all urine and follow up in 7 days with your urologist for ongoing care    Schelly Chuba, MD 10/23/14 [id number]

## 2014-10-23 NOTE — ED Notes (Addendum)
C/o L flank pain, radiates around to side, also nv, vomiting x2 at home, x3 here, admits to some recent urinary sx, [facility], (denies: fever, vaginal sx, abd pain, diarrhea, bleeding), last ate 1800, last BM PTA (normal). Has seen Dr. Laverle Patter at [facility] urology in the past.

## 2014-10-23 NOTE — ED Notes (Signed)
Dr. Nicanor Alcon at [facility] updating pt about results and d/c plan.

## 2015-10-10 ENCOUNTER — Ambulatory Visit (INDEPENDENT_AMBULATORY_CARE_PROVIDER_SITE_OTHER): Payer: BC Managed Care – PPO | Admitting: Otolaryngology

## 2015-10-10 DIAGNOSIS — J343 Hypertrophy of nasal turbinates: Secondary | ICD-10-CM

## 2015-10-10 DIAGNOSIS — J342 Deviated nasal septum: Secondary | ICD-10-CM

## 2015-10-20 ENCOUNTER — Other Ambulatory Visit (INDEPENDENT_AMBULATORY_CARE_PROVIDER_SITE_OTHER): Payer: Self-pay | Admitting: Otolaryngology

## 2015-10-20 DIAGNOSIS — J329 Chronic sinusitis, unspecified: Secondary | ICD-10-CM

## 2015-10-27 ENCOUNTER — Ambulatory Visit
Admission: RE | Admit: 2015-10-27 | Discharge: 2015-10-27 | Disposition: A | Discharge status: Home / Self Care | Payer: BC Managed Care – PPO | Source: Ambulatory Visit | Attending: Otolaryngology | Admitting: Otolaryngology

## 2015-10-27 DIAGNOSIS — J329 Chronic sinusitis, unspecified: Secondary | ICD-10-CM

## 2015-10-29 DIAGNOSIS — J342 Deviated nasal septum: Secondary | ICD-10-CM

## 2015-10-29 DIAGNOSIS — J329 Chronic sinusitis, unspecified: Secondary | ICD-10-CM

## 2015-10-29 HISTORY — DX: Chronic sinusitis, unspecified: J32.9

## 2015-10-29 HISTORY — DX: Deviated nasal septum: J34.2

## 2015-10-31 ENCOUNTER — Ambulatory Visit (INDEPENDENT_AMBULATORY_CARE_PROVIDER_SITE_OTHER): Payer: BC Managed Care – PPO | Admitting: Otolaryngology

## 2015-10-31 DIAGNOSIS — J342 Deviated nasal septum: Secondary | ICD-10-CM | POA: Diagnosis not present

## 2015-10-31 DIAGNOSIS — J310 Chronic rhinitis: Secondary | ICD-10-CM | POA: Diagnosis not present

## 2015-10-31 DIAGNOSIS — J320 Chronic maxillary sinusitis: Secondary | ICD-10-CM | POA: Diagnosis not present

## 2015-10-31 DIAGNOSIS — J330 Polyp of nasal cavity: Secondary | ICD-10-CM | POA: Diagnosis not present

## 2015-11-02 ENCOUNTER — Other Ambulatory Visit: Payer: Self-pay | Admitting: Otolaryngology

## 2015-11-03 ENCOUNTER — Encounter (HOSPITAL_BASED_OUTPATIENT_CLINIC_OR_DEPARTMENT_OTHER): Payer: Self-pay | Admitting: *Deleted

## 2015-11-08 ENCOUNTER — Ambulatory Visit (HOSPITAL_BASED_OUTPATIENT_CLINIC_OR_DEPARTMENT_OTHER): Admission: RE | Admit: 2015-11-08 | Payer: BC Managed Care – PPO | Source: Ambulatory Visit | Admitting: Otolaryngology

## 2015-11-08 HISTORY — DX: Chronic sinusitis, unspecified: J32.9

## 2015-11-08 HISTORY — DX: Personal history of urinary calculi: Z87.442

## 2015-11-08 HISTORY — DX: Deviated nasal septum: J34.2

## 2015-11-08 SURGERY — FESS, WITH MAXILLARY ANTROSTOMY, SEPTOPLASTY, AND ETHMOIDECTOMY
Anesthesia: General | Laterality: Bilateral

## 2015-11-24 ENCOUNTER — Other Ambulatory Visit: Payer: Self-pay | Admitting: Otolaryngology

## 2016-01-27 ENCOUNTER — Other Ambulatory Visit: Payer: Self-pay | Admitting: Family Medicine

## 2016-01-27 DIAGNOSIS — Z1231 Encounter for screening mammogram for malignant neoplasm of breast: Secondary | ICD-10-CM

## 2016-02-10 ENCOUNTER — Ambulatory Visit
Admission: RE | Admit: 2016-02-10 | Discharge: 2016-02-10 | Disposition: A | Discharge status: Home / Self Care | Payer: BC Managed Care – PPO | Source: Ambulatory Visit | Attending: Family Medicine | Admitting: Family Medicine

## 2016-02-10 DIAGNOSIS — Z1231 Encounter for screening mammogram for malignant neoplasm of breast: Secondary | ICD-10-CM

## 2016-02-14 ENCOUNTER — Encounter (HOSPITAL_BASED_OUTPATIENT_CLINIC_OR_DEPARTMENT_OTHER): Payer: Self-pay | Admitting: *Deleted

## 2016-02-17 ENCOUNTER — Other Ambulatory Visit: Payer: Self-pay | Admitting: Otolaryngology

## 2016-02-20 ENCOUNTER — Ambulatory Visit (HOSPITAL_BASED_OUTPATIENT_CLINIC_OR_DEPARTMENT_OTHER): Payer: BC Managed Care – PPO | Admitting: Anesthesiology

## 2016-02-20 ENCOUNTER — Encounter (HOSPITAL_BASED_OUTPATIENT_CLINIC_OR_DEPARTMENT_OTHER)
Admission: RE | Disposition: A | Discharge status: Home / Self Care | Payer: Self-pay | Source: Ambulatory Visit | Attending: Otolaryngology

## 2016-02-20 ENCOUNTER — Encounter (HOSPITAL_BASED_OUTPATIENT_CLINIC_OR_DEPARTMENT_OTHER): Payer: Self-pay | Admitting: Anesthesiology

## 2016-02-20 ENCOUNTER — Ambulatory Visit (HOSPITAL_BASED_OUTPATIENT_CLINIC_OR_DEPARTMENT_OTHER)
Admission: RE | Admit: 2016-02-20 | Discharge: 2016-02-20 | Disposition: A | Discharge status: Home / Self Care | Payer: BC Managed Care – PPO | Source: Ambulatory Visit | Attending: Otolaryngology | Admitting: Otolaryngology

## 2016-02-20 DIAGNOSIS — Z6837 Body mass index (BMI) 37.0-37.9, adult: Secondary | ICD-10-CM | POA: Insufficient documentation

## 2016-02-20 DIAGNOSIS — J320 Chronic maxillary sinusitis: Secondary | ICD-10-CM | POA: Insufficient documentation

## 2016-02-20 DIAGNOSIS — J342 Deviated nasal septum: Secondary | ICD-10-CM | POA: Diagnosis not present

## 2016-02-20 DIAGNOSIS — J3489 Other specified disorders of nose and nasal sinuses: Secondary | ICD-10-CM | POA: Insufficient documentation

## 2016-02-20 DIAGNOSIS — E669 Obesity, unspecified: Secondary | ICD-10-CM | POA: Insufficient documentation

## 2016-02-20 DIAGNOSIS — J338 Other polyp of sinus: Secondary | ICD-10-CM | POA: Diagnosis not present

## 2016-02-20 DIAGNOSIS — R569 Unspecified convulsions: Secondary | ICD-10-CM | POA: Insufficient documentation

## 2016-02-20 HISTORY — PX: NASAL SINUS SURGERY: SHX719

## 2016-02-20 HISTORY — PX: SEPTOPLASTY: SHX2393

## 2016-02-20 SURGERY — SINUS SURGERY, ENDOSCOPIC
Anesthesia: General | Site: Nose

## 2016-02-20 MED ORDER — SODIUM CHLORIDE 0.9 % IJ SOLN
INTRAMUSCULAR | Status: AC
  Filled 2016-02-20: qty 10

## 2016-02-20 MED ORDER — LIDOCAINE-EPINEPHRINE 1 %-1:100000 IJ SOLN
INTRAMUSCULAR | Status: DC | PRN
  Administered 2016-02-20: 2 mL

## 2016-02-20 MED ORDER — FENTANYL CITRATE (PF) 100 MCG/2ML IJ SOLN
50.0000 ug | INTRAMUSCULAR | Status: AC | PRN
  Administered 2016-02-20 (×3): 50 ug via INTRAVENOUS
  Administered 2016-02-20: 100 ug via INTRAVENOUS
  Administered 2016-02-20: 50 ug via INTRAVENOUS

## 2016-02-20 MED ORDER — MIDAZOLAM HCL 2 MG/2ML IJ SOLN
INTRAMUSCULAR | Status: AC
  Filled 2016-02-20: qty 2

## 2016-02-20 MED ORDER — SUCCINYLCHOLINE CHLORIDE 20 MG/ML IJ SOLN
INTRAMUSCULAR | Status: DC | PRN
  Administered 2016-02-20: 140 mg via INTRAVENOUS

## 2016-02-20 MED ORDER — AMOXICILLIN 875 MG PO TABS: 875.0000 mg | ORAL_TABLET | Freq: Two times a day (BID) | ORAL | 0 refills | Status: DC

## 2016-02-20 MED ORDER — PROMETHAZINE HCL 25 MG/ML IJ SOLN
INTRAMUSCULAR | Status: AC
  Filled 2016-02-20: qty 1

## 2016-02-20 MED ORDER — FENTANYL CITRATE (PF) 100 MCG/2ML IJ SOLN
INTRAMUSCULAR | Status: AC
  Filled 2016-02-20: qty 2

## 2016-02-20 MED ORDER — COCAINE HCL 4 % EX SOLN
CUTANEOUS | Status: AC
Start: 2016-02-20 — End: 2016-02-20
  Filled 2016-02-20: qty 4

## 2016-02-20 MED ORDER — OXYCODONE-ACETAMINOPHEN 5-325 MG PO TABS: 1.0000 | ORAL_TABLET | Freq: Four times a day (QID) | ORAL | 0 refills | Status: DC | PRN

## 2016-02-20 MED ORDER — LACTATED RINGERS IV SOLN
INTRAVENOUS | Status: DC
  Administered 2016-02-20 (×3): via INTRAVENOUS

## 2016-02-20 MED ORDER — COCAINE HCL 4 % EX SOLN
CUTANEOUS | Status: DC | PRN
  Administered 2016-02-20: 4 mL via NASAL

## 2016-02-20 MED ORDER — LIDOCAINE 2% (20 MG/ML) 5 ML SYRINGE
INTRAMUSCULAR | Status: AC
  Filled 2016-02-20: qty 5

## 2016-02-20 MED ORDER — ONDANSETRON HCL 4 MG/2ML IJ SOLN
INTRAMUSCULAR | Status: AC
  Filled 2016-02-20: qty 2

## 2016-02-20 MED ORDER — CEFAZOLIN SODIUM-DEXTROSE 2-4 GM/100ML-% IV SOLN
INTRAVENOUS | Status: AC
  Filled 2016-02-20: qty 100

## 2016-02-20 MED ORDER — GLYCOPYRROLATE 0.2 MG/ML IJ SOLN: 0.2000 mg | Freq: Once | INTRAMUSCULAR | Status: DC | PRN

## 2016-02-20 MED ORDER — ONDANSETRON HCL 4 MG/2ML IJ SOLN
INTRAMUSCULAR | Status: DC | PRN
  Administered 2016-02-20: 4 mg via INTRAVENOUS

## 2016-02-20 MED ORDER — DEXAMETHASONE SODIUM PHOSPHATE 4 MG/ML IJ SOLN
INTRAMUSCULAR | Status: DC | PRN
  Administered 2016-02-20: 10 mg via INTRAVENOUS

## 2016-02-20 MED ORDER — PROPOFOL 10 MG/ML IV BOLUS
INTRAVENOUS | Status: AC
  Filled 2016-02-20: qty 20

## 2016-02-20 MED ORDER — DEXAMETHASONE SODIUM PHOSPHATE 10 MG/ML IJ SOLN
INTRAMUSCULAR | Status: AC
  Filled 2016-02-20: qty 1

## 2016-02-20 MED ORDER — PROMETHAZINE HCL 25 MG/ML IJ SOLN
6.2500 mg | INTRAMUSCULAR | Status: DC | PRN
  Administered 2016-02-20: 6.25 mg via INTRAVENOUS

## 2016-02-20 MED ORDER — SCOPOLAMINE 1 MG/3DAYS TD PT72: 1.0000 | MEDICATED_PATCH | Freq: Once | TRANSDERMAL | Status: DC | PRN

## 2016-02-20 MED ORDER — MIDAZOLAM HCL 2 MG/2ML IJ SOLN
1.0000 mg | INTRAMUSCULAR | Status: DC | PRN
  Administered 2016-02-20: 2 mg via INTRAVENOUS

## 2016-02-20 MED ORDER — CEFAZOLIN SODIUM-DEXTROSE 2-3 GM-% IV SOLR
INTRAVENOUS | Status: DC | PRN
  Administered 2016-02-20: 2 g via INTRAVENOUS

## 2016-02-20 MED ORDER — PROPOFOL 10 MG/ML IV BOLUS
INTRAVENOUS | Status: DC | PRN
  Administered 2016-02-20: 50 mg via INTRAVENOUS
  Administered 2016-02-20: 200 mg via INTRAVENOUS

## 2016-02-20 MED ORDER — FENTANYL CITRATE (PF) 100 MCG/2ML IJ SOLN
25.0000 ug | INTRAMUSCULAR | Status: DC | PRN
  Administered 2016-02-20 (×4): 25 ug via INTRAVENOUS
  Administered 2016-02-20: 50 ug via INTRAVENOUS

## 2016-02-20 MED ORDER — MUPIROCIN 2 % EX OINT
TOPICAL_OINTMENT | CUTANEOUS | Status: DC | PRN
  Administered 2016-02-20: 1 via NASAL

## 2016-02-20 SURGICAL SUPPLY — 62 items
ATTRACTOMAT 16X20 MAGNETIC DRP (DRAPES) IMPLANT
BLADE RAD40 ROTATE 4M 4 5PK (BLADE) IMPLANT
BLADE RAD40 ROTATE 4M 4MM 5PK (BLADE)
BLADE RAD60 ROTATE M4 4 5PK (BLADE) ×1 IMPLANT
BLADE RAD60 ROTATE M4 4MM 5PK (BLADE) ×1
BLADE SURG 15 STRL LF DISP TIS (BLADE) IMPLANT
BLADE SURG 15 STRL SS (BLADE)
BLADE TRICUT ROTATE M4 4 5PK (BLADE) ×1 IMPLANT
BLADE TRICUT ROTATE M4 4MM 5PK (BLADE) ×1
BUR HS RAD FRONTAL 3 (BURR) IMPLANT
BUR HS RAD FRONTAL 3MM (BURR)
CANISTER SUC SOCK COL 7IN (MISCELLANEOUS) ×8 IMPLANT
CANISTER SUCT 1200ML W/VALVE (MISCELLANEOUS) ×4 IMPLANT
COAGULATOR SUCT 6 FR SWTCH (ELECTROSURGICAL)
COAGULATOR SUCT 8FR VV (MISCELLANEOUS) ×4 IMPLANT
COAGULATOR SUCT SWTCH 10FR 6 (ELECTROSURGICAL) IMPLANT
DECANTER SPIKE VIAL GLASS SM (MISCELLANEOUS) IMPLANT
DRSG NASAL KENNEDY LMNT 8CM (GAUZE/BANDAGES/DRESSINGS) IMPLANT
DRSG NASOPORE 8CM (GAUZE/BANDAGES/DRESSINGS) IMPLANT
DRSG TELFA 3X8 NADH (GAUZE/BANDAGES/DRESSINGS) IMPLANT
ELECT REM PT RETURN 9FT ADLT (ELECTROSURGICAL) ×4
ELECTRODE REM PT RTRN 9FT ADLT (ELECTROSURGICAL) ×2 IMPLANT
GLOVE BIO SURGEON STRL SZ 6.5 (GLOVE) ×1 IMPLANT
GLOVE BIO SURGEON STRL SZ7.5 (GLOVE) ×6 IMPLANT
GLOVE BIO SURGEONS STRL SZ 6.5 (GLOVE) ×1
GLOVE BIOGEL PI IND STRL 7.0 (GLOVE) IMPLANT
GLOVE BIOGEL PI IND STRL 8 (GLOVE) IMPLANT
GLOVE BIOGEL PI INDICATOR 7.0 (GLOVE) ×2
GLOVE BIOGEL PI INDICATOR 8 (GLOVE) ×2
GLOVE SURG SS PI 7.0 STRL IVOR (GLOVE) ×2 IMPLANT
GOWN STRL REUS W/ TWL LRG LVL3 (GOWN DISPOSABLE) ×4 IMPLANT
GOWN STRL REUS W/TWL LRG LVL3 (GOWN DISPOSABLE) ×12
HEMOSTAT SURGICEL 2X14 (HEMOSTASIS) IMPLANT
IV NS 500ML (IV SOLUTION) ×4
IV NS 500ML BAXH (IV SOLUTION) ×2 IMPLANT
IV SET EXT 30 76VOL 4 MALE LL (IV SETS) IMPLANT
NDL HYPO 25X1 1.5 SAFETY (NEEDLE) ×2 IMPLANT
NDL SPNL 25GX3.5 QUINCKE BL (NEEDLE) IMPLANT
NEEDLE HYPO 25X1 1.5 SAFETY (NEEDLE) ×4 IMPLANT
NEEDLE SPNL 25GX3.5 QUINCKE BL (NEEDLE) IMPLANT
NS IRRIG 1000ML POUR BTL (IV SOLUTION) IMPLANT
PACK BASIN DAY SURGERY FS (CUSTOM PROCEDURE TRAY) ×4 IMPLANT
PACK ENT DAY SURGERY (CUSTOM PROCEDURE TRAY) ×4 IMPLANT
PACKING NASAL EPIS 4X2.4 XEROG (MISCELLANEOUS) IMPLANT
PAD DRESSING TELFA 3X8 NADH (GAUZE/BANDAGES/DRESSINGS) IMPLANT
SLEEVE SCD COMPRESS KNEE MED (MISCELLANEOUS) ×4 IMPLANT
SOLUTION BUTLER CLEAR DIP (MISCELLANEOUS) ×8 IMPLANT
SPLINT NASAL AIRWAY SILICONE (MISCELLANEOUS) ×4 IMPLANT
SPONGE GAUZE 2X2 8PLY STER LF (GAUZE/BANDAGES/DRESSINGS) ×1
SPONGE GAUZE 2X2 8PLY STRL LF (GAUZE/BANDAGES/DRESSINGS) ×3 IMPLANT
SPONGE NEURO XRAY DETECT 1X3 (DISPOSABLE) ×4 IMPLANT
SUT CHROMIC 4 0 P 3 18 (SUTURE) ×4 IMPLANT
SUT ETHILON 3 0 PS 1 (SUTURE) IMPLANT
SUT PLAIN 4 0 ~~LOC~~ 1 (SUTURE) ×4 IMPLANT
SUT PROLENE 3 0 PS 2 (SUTURE) ×4 IMPLANT
SUT VIC AB 4-0 P-3 18XBRD (SUTURE) IMPLANT
SUT VIC AB 4-0 P3 18 (SUTURE)
TOWEL OR 17X24 6PK STRL BLUE (TOWEL DISPOSABLE) ×4 IMPLANT
TUBE CONNECTING 20'X1/4 (TUBING) ×1
TUBE CONNECTING 20X1/4 (TUBING) ×3 IMPLANT
TUBE SALEM SUMP 16 FR W/ARV (TUBING) IMPLANT
YANKAUER SUCT BULB TIP NO VENT (SUCTIONS) ×2 IMPLANT

## 2016-02-20 NOTE — Discharge Instructions (Addendum)

## 2016-02-20 NOTE — Anesthesia Postprocedure Evaluation (Signed)
Anesthesia Post Note  Patient: Wiliam KeJennifer A Guterrez  Procedure(s) Performed: Procedure(s) (LRB): BILATERAL ENDOSCOPIC MAXILLARY ANTROSTOMY (Bilateral) SEPTOPLASTY (N/A)  Patient location during evaluation: PACU Anesthesia Type: General Level of consciousness: awake and alert Pain management: pain level controlled Vital Signs Assessment: post-procedure vital signs reviewed and stable Respiratory status: spontaneous breathing, nonlabored ventilation, respiratory function stable and patient connected to nasal cannula oxygen Cardiovascular status: blood pressure returned to baseline and stable Postop Assessment: no signs of nausea or vomiting Anesthetic complications: no    Last Vitals:  Vitals:   02/20/16 1430 02/20/16 1433  BP: (!) 156/92   Pulse: 88 83  Resp: 16 15  Temp:      Last Pain:  Vitals:   02/20/16 1430  TempSrc:   PainSc: 4                  Omar Orrego J

## 2016-02-20 NOTE — Transfer of Care (Signed)
Immediate Anesthesia Transfer of Care Note  Patient: Jamie Knapp  Procedure(s) Performed: Procedure(s): BILATERAL ENDOSCOPIC MAXILLARY ANTROSTOMY (Bilateral) SEPTOPLASTY (N/A)  Patient Location: PACU  Anesthesia Type:General  Level of Consciousness: awake, alert  and oriented  Airway & Oxygen Therapy: Patient Spontanous Breathing and Patient connected to face mask oxygen  Post-op Assessment: Report given to RN and Post -op Vital signs reviewed and stable  Post vital signs: Reviewed and stable  Last Vitals:  Vitals:   02/20/16 1256 02/20/16 1257  BP:  (!) 151/85  Pulse: (!) 106 (!) 102  Resp:  12  Temp:      Last Pain:  Vitals:   02/20/16 0950  TempSrc: Oral      Patients Stated Pain Goal: 2 (02/20/16 0950)  Complications: No apparent anesthesia complications

## 2016-02-20 NOTE — Op Note (Signed)
DATE OF PROCEDURE: 02/20/2016  OPERATIVE REPORT   SURGEON: Newman PiesSu Karina Lenderman, MD   PREOPERATIVE DIAGNOSES:  1. Nasal septal deviation.  2. Bilateral chronic maxillary sinusitis. 3. Chronic nasal obstruction.  POSTOPERATIVE DIAGNOSES:  1. Nasal septal deviation.  2. Bilateral chronic maxillary sinusitis. 3. Chronic nasal obstruction.  PROCEDURE PERFORMED:  1. Septoplasty.  2. Bilateral endoscopic maxillary antrostomy with polyp removal  ANESTHESIA: General endotracheal tube anesthesia.   COMPLICATIONS: None.   ESTIMATED BLOOD LOSS: Approximately 300 mL.   INDICATION FOR PROCEDURE: Jamie Knapp is a 40 y.o. female with a history of chronic maxillary sinusitis, nasal septal deviation, and chronic nasal obstruction. The patient was treated with multiple courses of antibiotics, antihistamine, decongestant, steroid nasal spray, and systemic steroids. However, the patient continues to be symptomatic. On examination, the patient was noted to have bilateral chronic rhinosinusitis and significant nasal septal deviation, causing significant nasal obstruction. Based on the above findings, the decision was made for the patient to undergo the above-stated procedures. The risks, benefits, alternatives, and details of the procedure were discussed with the patient. Questions were invited and answered. Informed consent was obtained.   DESCRIPTION OF PROCEDURE: The patient was taken to the operating room and placed supine on the operating table. General endotracheal tube anesthesia was administered by the anesthesiologist. The patient was positioned, and prepped and draped in the standard fashion for nasal surgery. Pledgets soaked with Afrin were placed in both nasal cavities for decongestion. The pledgets were subsequently removed. The above mentioned severe septal deviation was again noted. 1% lidocaine with 1:100,000 epinephrine was injected onto the nasal septum bilaterally. A hemitransfixion incision was  made on the left side. The mucosal flap was carefully elevated on the left side. A cartilaginous incision was made 1 cm superior to the caudal margin of the nasal septum. Mucosal flap was also elevated on the right side in the similar fashion. It should be noted that due to the severe septal deviation, the deviated portion of the cartilaginous and bony septum had to be removed in piecemeal fashion. Once the deviated portions were removed, a straight midline septum was achieved. The septum was then quilted with 4-0 plain gut sutures. The hemitransfixion incision was closed with interrupted 4-0 chromic sutures.   Attention was then focused on the maxillary sinuses. Using a 0 endoscope, the left nasal cavity was examined. The left middle turbinate was carefully medialized. The uncinate process was resected with a freer elevator. The maxillary antrum was entered and enlarged using a combination of backbiter, Tru-Cut forceps, and microdebrider. Polypoid tissue was noted to fill the left maxillary sinus. The polypoid tissue was removed using a combination of Blakesley forceps and microdebrider. The left maxillary sinus was copiously irrigated. The same procedure was repeated on the right site without exception. Hemostasis was achieved with nasopore packing.  The care of the patient was turned over to the anesthesiologist. The patient was awakened from anesthesia without difficulty. The patient was extubated and transferred to the recovery room in good condition.   OPERATIVE FINDINGS: Severe nasal septal deviation and bilateral chronic maxillary sinusitis. Both maxillary sinuses were filled with polypoid tissue.  SPECIMEN: Bilateral sinus contents.   FOLLOWUP CARE: The patient be discharged home once she is awake and alert. The patient will be placed on Percocet 1-2 tablets p.o. q.6 hours p.r.n. pain, and amoxicillin 875 mg p.o. b.i.d. for 5 days. The patient will follow up in my office in approximately 1 week  for splint removal.   Janeece RiggersSu [facility]  Suszanne Conners, MD

## 2016-02-20 NOTE — H&P (Signed)
Cc: Chronic maxillary sinusitis, nasal obstruction, septal deviation  HPI: The patient is a 40 year old female who returns today for her follow-up evaluation.  The patient was last seen 3 weeks ago.  At that time, the patient was noted to have left septal deviation and findings suggestive of chronic maxillary sinusitis. The patient was placed on a 12 day course of high dose prednisone.  Her repeat CT scan showed complete opacification of her right maxillary sinus with a large retention cyst within the left maxillary sinus.  In addition, severe nasal septal deviation to the left was also noted.  The patient returns complaining of persistent pressure over her mid face.  She also complains of frequent nasal obstruction. She did not notice any significant improvement with her medical treatment.   Exam: The nasal cavities were decongested and anesthetised with a combination of oxymetazoline and 4% lidocaine solution.  The flexible scope was inserted into the right nasal cavity.  Endoscopy of the inferior and middle meatus was performed.  Edematous mucosa was noted.  Severe left nasal septal deviation.  Olfactory cleft was clear.  Nasopharynx was clear.  Turbinates were hypertrophied but without mass.   The procedure was repeated on the contralateral side with similar findings.  The patient tolerated the procedure well.  Instructions were given to avoid eating or drinking for 2 hours.    Assessment: 1.  Near complete opacification of the right maxillary sinus and a large retention cyst within the left maxillary sinus.  2.  Severe left nasal septal deviation.   Plan: 1.  The CT images and nasal endoscopy findings are reviewed with the patient.  2.  The patient will benefit from undergoing surgical intervention to open her maxillary sinuses and to straighten her septum.  The risks, benefits, alternatives and details of the procedure are reviewed.  3.  The patient would like to proceed with the  procedure.

## 2016-02-20 NOTE — Anesthesia Procedure Notes (Signed)
Procedure Name: Intubation Date/Time: 02/20/2016 11:13 AM Performed by: Burna CashONRAD, Catricia Scheerer C Pre-anesthesia Checklist: Patient identified, Emergency Drugs available, Suction available and Patient being monitored Patient Re-evaluated:Patient Re-evaluated prior to inductionOxygen Delivery Method: Circle system utilized Preoxygenation: Pre-oxygenation with 100% oxygen Intubation Type: IV induction Ventilation: Mask ventilation without difficulty Laryngoscope Size: Mac and 3 Grade View: Grade I Tube type: Oral Number of attempts: 1 Airway Equipment and Method: Stylet and Oral airway Placement Confirmation: ETT inserted through vocal cords under direct vision,  positive ETCO2 and breath sounds checked- equal and bilateral Secured at: 23 cm Tube secured with: Tape Dental Injury: Teeth and Oropharynx as per pre-operative assessment

## 2016-02-20 NOTE — Anesthesia Preprocedure Evaluation (Addendum)
Anesthesia Evaluation  Patient identified by MRN, date of birth, ID band Patient awake    Reviewed: Allergy & Precautions, NPO status , Patient's Chart, lab work & pertinent test results  Airway Mallampati: II  TM Distance: >3 FB Neck ROM: Full    Dental no notable dental hx.    Pulmonary neg pulmonary ROS,    Pulmonary exam normal breath sounds clear to auscultation       Cardiovascular negative cardio ROS Normal cardiovascular exam Rhythm:Regular Rate:Normal     Neuro/Psych Seizures -, Well Controlled,  Last seizure, petit mal, 05/2015 negative psych ROS   GI/Hepatic negative GI ROS, Neg liver ROS,   Endo/Other  negative endocrine ROS  Renal/GU negative Renal ROS  negative genitourinary   Musculoskeletal negative musculoskeletal ROS (+)   Abdominal (+) + obese,   Peds negative pediatric ROS (+)  Hematology negative hematology ROS (+)   Anesthesia Other Findings   Reproductive/Obstetrics negative OB ROS                            Anesthesia Physical Anesthesia Plan  ASA: II  Anesthesia Plan: General   Post-op Pain Management:    Induction: Intravenous  Airway Management Planned: Oral ETT  Additional Equipment:   Intra-op Plan:   Post-operative Plan: Extubation in OR  Informed Consent: I have reviewed the patients History and Physical, chart, labs and discussed the procedure including the risks, benefits and alternatives for the proposed anesthesia with the patient or authorized representative who has indicated his/her understanding and acceptance.   Dental advisory given  Plan Discussed with: CRNA  Anesthesia Plan Comments:         Anesthesia Quick Evaluation

## 2016-02-21 ENCOUNTER — Encounter (HOSPITAL_BASED_OUTPATIENT_CLINIC_OR_DEPARTMENT_OTHER): Payer: Self-pay | Admitting: Otolaryngology

## 2016-02-27 ENCOUNTER — Ambulatory Visit (INDEPENDENT_AMBULATORY_CARE_PROVIDER_SITE_OTHER): Payer: BC Managed Care – PPO | Admitting: Otolaryngology

## 2016-03-15 ENCOUNTER — Ambulatory Visit (INDEPENDENT_AMBULATORY_CARE_PROVIDER_SITE_OTHER): Payer: BC Managed Care – PPO | Admitting: Otolaryngology

## 2016-03-15 DIAGNOSIS — J320 Chronic maxillary sinusitis: Secondary | ICD-10-CM | POA: Diagnosis not present

## 2016-03-15 DIAGNOSIS — J338 Other polyp of sinus: Secondary | ICD-10-CM | POA: Diagnosis not present

## 2016-08-16 ENCOUNTER — Ambulatory Visit (INDEPENDENT_AMBULATORY_CARE_PROVIDER_SITE_OTHER): Payer: BC Managed Care – PPO | Admitting: Otolaryngology

## 2016-08-16 DIAGNOSIS — J310 Chronic rhinitis: Secondary | ICD-10-CM

## 2016-08-16 DIAGNOSIS — J320 Chronic maxillary sinusitis: Secondary | ICD-10-CM | POA: Diagnosis not present

## 2017-01-28 IMAGING — CT CT MAXILLOFACIAL W/O CM
3 series · 15 of 47 positions shown, 18 images · non-contrast
Comparison: None.

CLINICAL DATA: Chronic sinusitis

EXAM:
CT MAXILLOFACIAL WITHOUT CONTRAST
TECHNIQUE: Multidetector CT imaging of the maxillofacial structures was
performed. Multiplanar CT image reconstructions were also generated.
A small metallic BB was placed on the right temple in order to
reliably differentiate right from left.

[Series 3: soft tissue · axial · 0.48mm/px · z∈[-177,-51]mm · 9 of 148 slices shown, 12 images]
[im 11/148  brain]
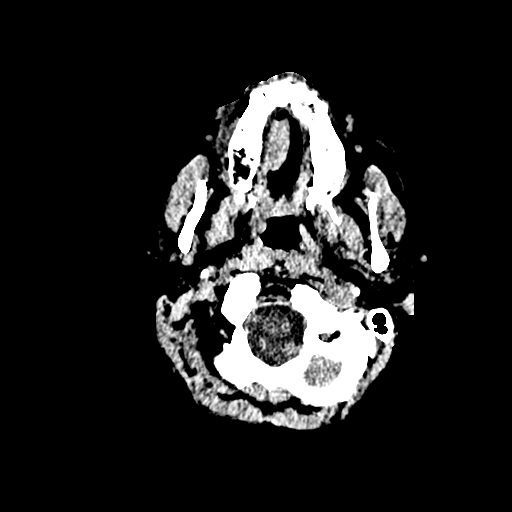
[im 11/148  bone]
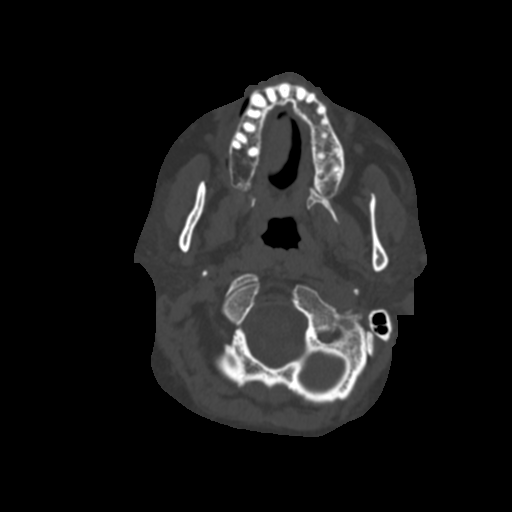
[im 26/148  bone]
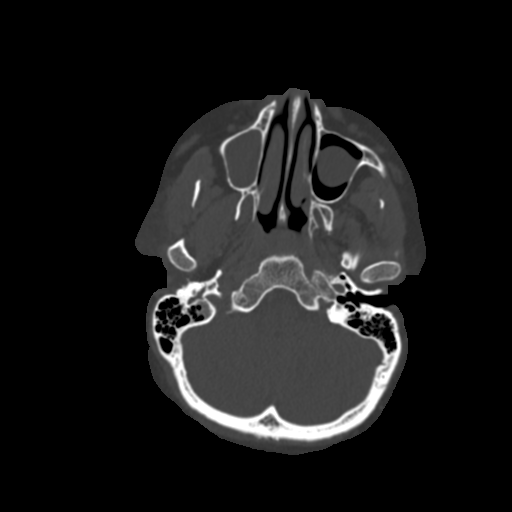
[im 41/148  bone]
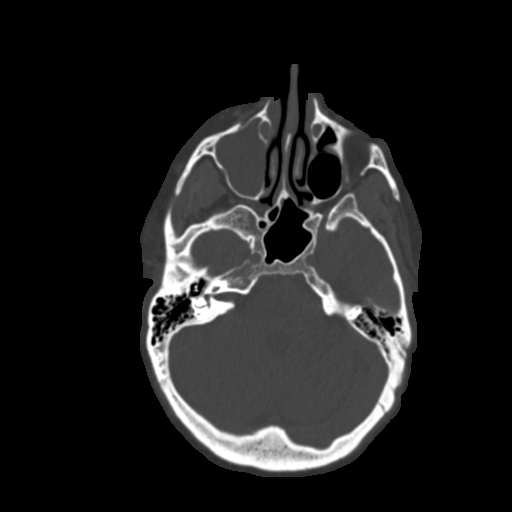
[im 56/148  bone]
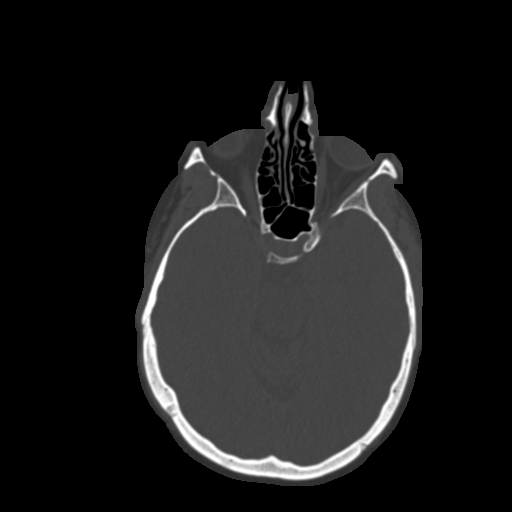
[im 77/148  brain]
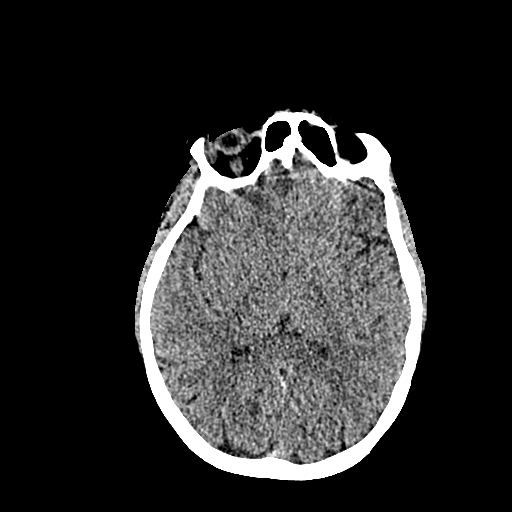
[im 77/148  bone]
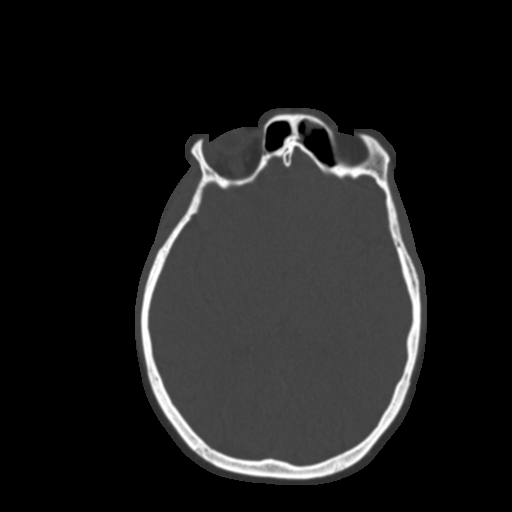
[im 92/148  bone]
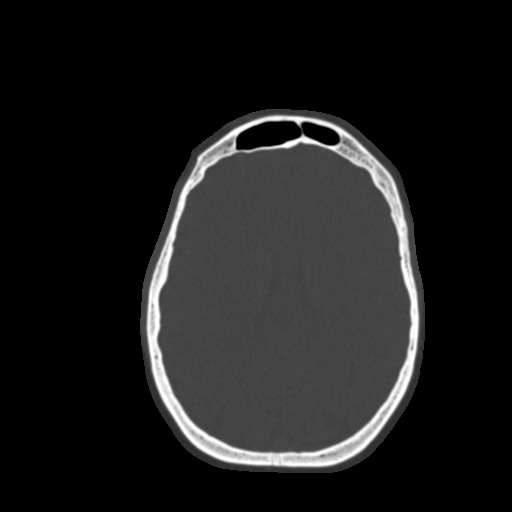
[im 107/148  bone]
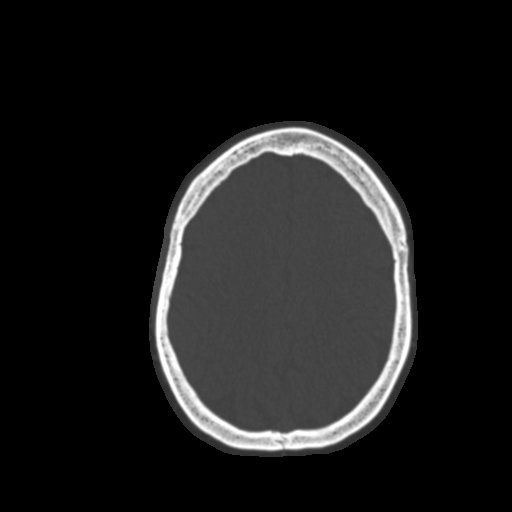
[im 122/148  bone]
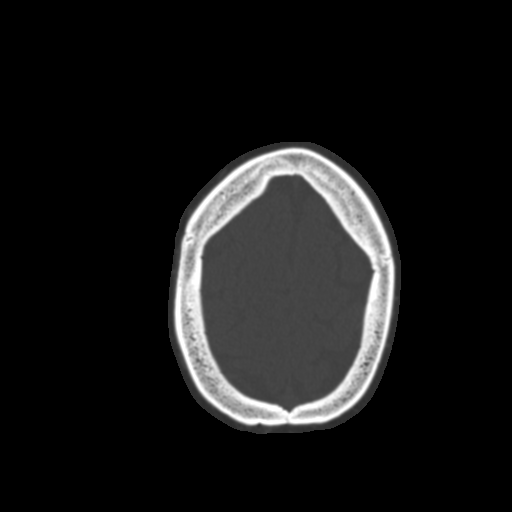
[im 137/148  brain]
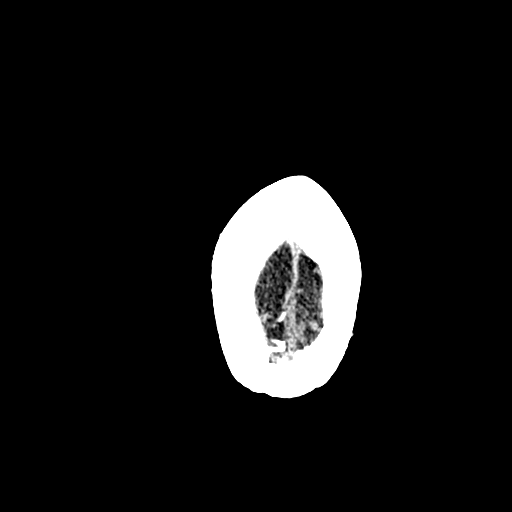
[im 137/148  bone]
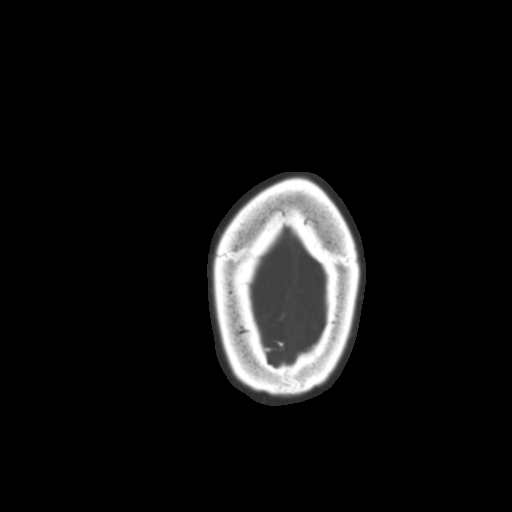

[Series 6: cor soft · coronal · 0.29mm/px · 3 of 120 slices shown]
[im 40/120  bone]
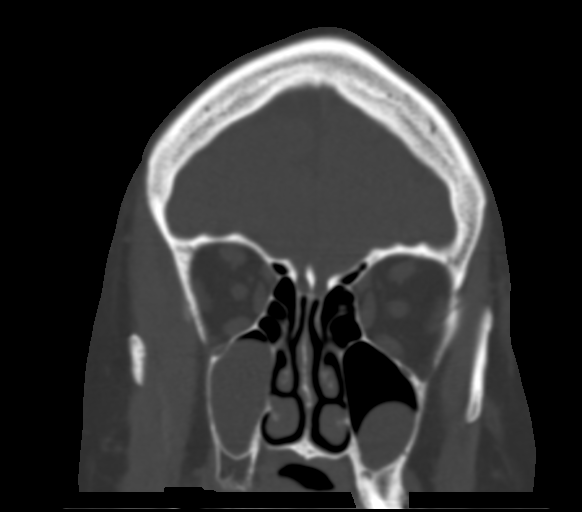
[im 53/120  bone]
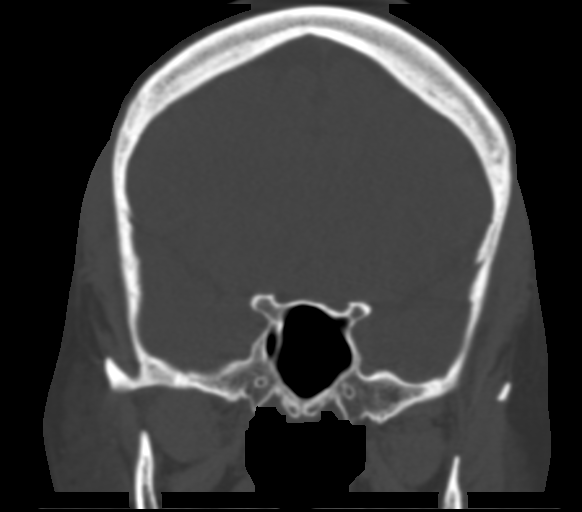
[im 67/120  bone]
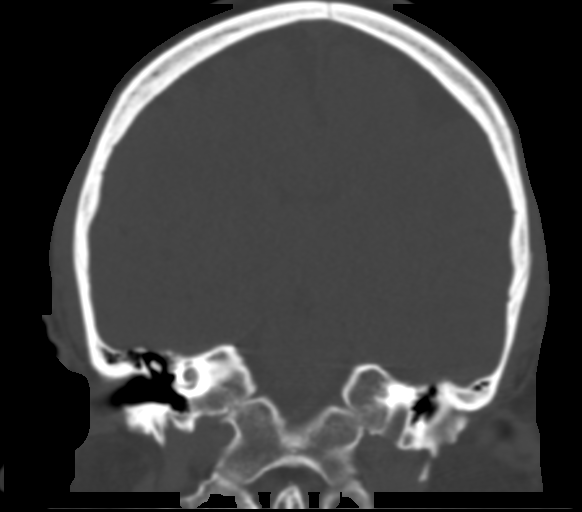

[Series 7: sag soft · sagittal · 0.29mm/px · 3 of 81 slices shown]
[im 27/81  bone]
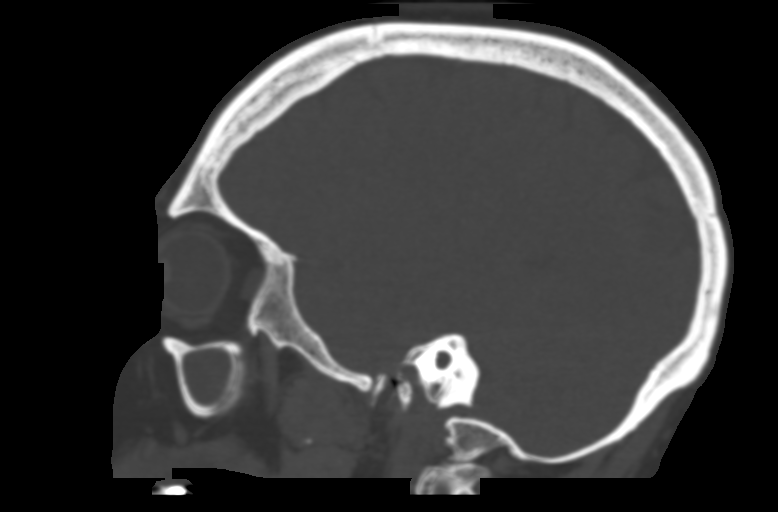
[im 41/81  bone]
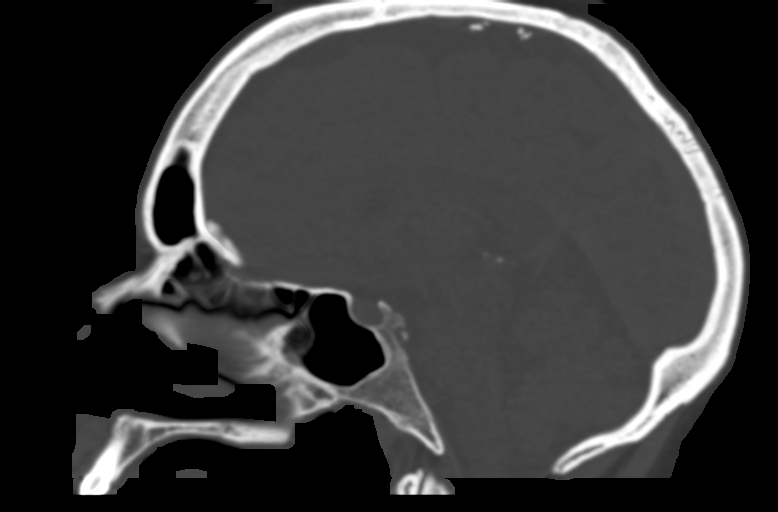
[im 54/81  bone]
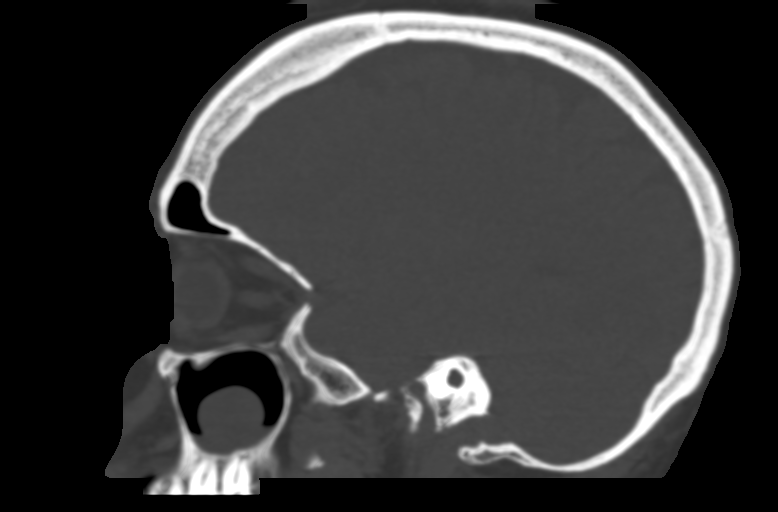

[15 of 47 positions shown; findings below may reference images not displayed]

FINDINGS: Near complete opacification of the right maxillary sinus. This
appears to be a large retention cyst. No bony destruction.
Ostiomeatal complex patent.

Smaller retention cyst left maxillary sinus. Mucosal edema overlying
the left ostium with mild narrowing of the ostiomeatal complex.

Mild mucosal edema left frontal sinus. Right frontal sinus clear.
Ethmoid sinuses clear bilaterally. Sphenoid sinus clear bilaterally.

Nasal septum deviated to the left.  No acute skeletal abnormality.

Mastoid sinus clear bilaterally.  Middle ear clear bilaterally.

Orbital soft tissues normal. Facial soft tissues normal.
Intracranial contents negative.
IMPRESSION: Large retention cyst right maxillary sinus. Moderate left maxillary
sinus retention cyst. Mild mucosal edema left frontal sinus.

## 2017-02-14 ENCOUNTER — Other Ambulatory Visit: Payer: Self-pay | Admitting: Family Medicine

## 2017-02-14 DIAGNOSIS — Z1231 Encounter for screening mammogram for malignant neoplasm of breast: Secondary | ICD-10-CM

## 2017-03-08 ENCOUNTER — Ambulatory Visit
Admission: RE | Admit: 2017-03-08 | Discharge: 2017-03-08 | Disposition: A | Discharge status: Home / Self Care | Payer: BC Managed Care – PPO | Source: Ambulatory Visit | Attending: Family Medicine | Admitting: Family Medicine

## 2017-03-08 DIAGNOSIS — Z1231 Encounter for screening mammogram for malignant neoplasm of breast: Secondary | ICD-10-CM

## 2017-08-29 ENCOUNTER — Ambulatory Visit (INDEPENDENT_AMBULATORY_CARE_PROVIDER_SITE_OTHER): Payer: BC Managed Care – PPO | Admitting: Otolaryngology

## 2017-08-29 DIAGNOSIS — J330 Polyp of nasal cavity: Secondary | ICD-10-CM

## 2017-08-29 DIAGNOSIS — J320 Chronic maxillary sinusitis: Secondary | ICD-10-CM

## 2018-02-28 ENCOUNTER — Other Ambulatory Visit (HOSPITAL_COMMUNITY)
Admission: RE | Admit: 2018-02-28 | Discharge: 2018-02-28 | Disposition: A | Discharge status: Home / Self Care | Payer: BC Managed Care – PPO | Source: Ambulatory Visit | Attending: Family Medicine | Admitting: Family Medicine

## 2018-02-28 ENCOUNTER — Other Ambulatory Visit: Payer: Self-pay | Admitting: Family Medicine

## 2018-02-28 DIAGNOSIS — Z124 Encounter for screening for malignant neoplasm of cervix: Secondary | ICD-10-CM | POA: Diagnosis not present

## 2018-03-04 LAB — CYTOLOGY - PAP
ADEQUACY: ABSENT
DIAGNOSIS: NEGATIVE
HPV: NOT DETECTED

## 2018-04-11 ENCOUNTER — Other Ambulatory Visit: Payer: Self-pay | Admitting: Family Medicine

## 2018-04-11 DIAGNOSIS — Z1231 Encounter for screening mammogram for malignant neoplasm of breast: Secondary | ICD-10-CM

## 2018-04-16 ENCOUNTER — Ambulatory Visit
Admission: RE | Admit: 2018-04-16 | Discharge: 2018-04-16 | Disposition: A | Discharge status: Home / Self Care | Payer: BC Managed Care – PPO | Source: Ambulatory Visit

## 2018-04-16 DIAGNOSIS — Z1231 Encounter for screening mammogram for malignant neoplasm of breast: Secondary | ICD-10-CM

## 2018-05-07 ENCOUNTER — Telehealth: Payer: BC Managed Care – PPO | Admitting: Nurse Practitioner

## 2018-05-07 DIAGNOSIS — B373 Candidiasis of vulva and vagina: Secondary | ICD-10-CM

## 2018-05-07 DIAGNOSIS — B3731 Acute candidiasis of vulva and vagina: Secondary | ICD-10-CM

## 2018-05-07 MED ORDER — FLUCONAZOLE 150 MG PO TABS: 150.0000 mg | ORAL_TABLET | Freq: Once | ORAL | 0 refills | Status: AC

## 2018-05-07 NOTE — Progress Notes (Signed)

## 2019-03-01 ENCOUNTER — Other Ambulatory Visit (HOSPITAL_COMMUNITY)
Admission: RE | Admit: 2019-03-01 | Discharge: 2019-03-01 | Disposition: A | Discharge status: Home / Self Care | Payer: BC Managed Care – PPO | Source: Ambulatory Visit | Attending: Family Medicine | Admitting: Family Medicine

## 2019-03-01 DIAGNOSIS — Z124 Encounter for screening for malignant neoplasm of cervix: Secondary | ICD-10-CM | POA: Diagnosis not present

## 2019-03-02 ENCOUNTER — Ambulatory Visit (INDEPENDENT_AMBULATORY_CARE_PROVIDER_SITE_OTHER): Payer: BC Managed Care – PPO | Admitting: Otolaryngology

## 2019-03-02 DIAGNOSIS — J310 Chronic rhinitis: Secondary | ICD-10-CM

## 2019-03-06 LAB — CYTOLOGY - PAP
Adequacy: ABSENT
Comment: NEGATIVE
Diagnosis: NEGATIVE
High risk HPV: NEGATIVE

## 2019-04-26 ENCOUNTER — Telehealth: Payer: BC Managed Care – PPO | Admitting: Physician Assistant

## 2019-04-26 DIAGNOSIS — Z20822 Contact with and (suspected) exposure to covid-19: Secondary | ICD-10-CM

## 2019-04-26 NOTE — Progress Notes (Signed)
I have spent 5 minutes in review of e-visit questionnaire, review and updating patient chart, medical decision making and response to patient.   Jamie Knapp Cody Pantelis Elgersma, PA-C    

## 2019-04-26 NOTE — Progress Notes (Signed)
E-Visit for Corona Virus Screening   Your current symptoms could be consistent with the coronavirus.  Many health care providers can now test patients at their office but not all are.  [place] has multiple testing sites. For information on our COVID testing locations and hours go to [email or web address]  We are enrolling you in our MyChart Home Monitoring for COVID19 . Daily you will receive a questionnaire within the MyChart website. Our COVID 19 response team will be monitoring your responses daily.  Testing Information: The COVID-19 Community Testing sites will begin testing BY APPOINTMENT ONLY.  You can schedule online at [email or web address]  If you do not have access to a smart phone or computer you may call [phone] for an appointment.  Testing Locations: Appointment schedule is 8 am to 3:30 pm at all sites  [place] indoors at [address], [place] [address] [facility]  indoors at [address] Visitors Entrance, [place], [place] [address] [place] indoors at [address], [place] [address]  Additional testing sites in the Community:  . For [facility] Testing sites in [place]  https://www.cvs.com/minuteclinic/covid-19-testing  . For Pop-up testing sites in [place]  https://covid19.ncdhhs.gov/about-covid-19/testing/find-my-testing-place/pop-testing-sites  . For Testing sites with regular hours https://onsms.org/[place]/  . For [place] https://[facility].com/covid-19-community-outreach-testing/  . For [facility] https://[place].gov[place]/human-services/health-department/coronavirus-covid-19-info/covid-19-testing  . For [place] testing in [place] and [place] https://www.[place].gov/[place]/human-services/health-department/coronavirus-covid-19-info/covid-19-testing  . For [facility] testing in [place]   https://[facility].care/covidtesting  For  more  information about community testing call [phone]   We are enrolling you in our MyChart Home Monitoring for COVID19 . Daily you will receive a questionnaire within the MyChart website. Our COVID 19 response team will be monitoring your responses daily.  Please quarantine yourself while awaiting your test results. If you develop fever/cough/breathlessness, please stay home for 10 days with improving symptoms and until you have had 24 hours of no fever (without taking a fever reducer).  You should wear a mask or cloth face covering over your nose and mouth if you must be around other people or animals, including pets (even at home). Try to stay at least 6 feet away from other people. This will protect the people around you.  Please continue good preventive care measures, including:  frequent hand-washing, avoid touching your face, cover coughs/sneezes, stay out of crowds and keep a 6 foot distance from others.  COVID-19 is a respiratory illness with symptoms that are similar to the flu. Symptoms are typically mild to moderate, but there have been cases of severe illness and death due to the virus.   The following symptoms may appear 2-14 days after exposure: . Fever . Cough . Shortness of breath or difficulty breathing . Chills . Repeated shaking with chills . Muscle pain . Headache . Sore throat . New loss of taste or smell . Fatigue . Congestion or runny nose . Nausea or vomiting . Diarrhea  Go to the nearest hospital ED for assessment if fever/cough/breathlessness are severe or illness seems like a threat to life.  It is vitally important that if you feel that you have an infection such as this virus or any other virus that you stay home and away from places where you may spread it to others.  You should avoid contact with people age 65 and older.   You may also take acetaminophen (Tylenol) as needed for fever.  Reduce your risk of any infection by using the same precautions used for    avoiding the common cold or flu:  . Wash your hands often with soap and warm water for at least 20 seconds.  If soap and water are not readily available, use an alcohol-based hand sanitizer with at least 60% alcohol.  . If coughing or sneezing, cover your mouth and nose by coughing or sneezing into the elbow areas of your shirt or coat, into a tissue or into your sleeve (not your hands). . Avoid shaking hands with others and consider head nods or verbal greetings only. . Avoid touching your eyes, nose, or mouth with unwashed hands.  . Avoid close contact with people who are sick. . Avoid places or events with large numbers of people in one location, like concerts or sporting events. . Carefully consider travel plans you have or are making. . If you are planning any travel outside or inside the [place], visit the CDC's Travelers' Health webpage for the latest health notices. . If you have some symptoms but not all symptoms, continue to monitor at home and seek medical attention if your symptoms worsen. . If you are having a medical emergency, call 911.  HOME CARE . Only take medications as instructed by your medical team. . Drink plenty of fluids and get plenty of rest. . A steam or ultrasonic humidifier can help if you have congestion.   GET HELP RIGHT AWAY IF YOU HAVE EMERGENCY WARNING SIGNS** FOR COVID-19. If you or someone is showing any of these signs seek emergency medical care immediately. Call 911 or proceed to your closest emergency facility if: . You develop worsening high fever. . Trouble breathing . Bluish lips or face . Persistent pain or pressure in the chest . New confusion . Inability to wake or stay awake . You cough up blood. . Your symptoms become more severe  **This list is not all possible symptoms. Contact your medical provider for any symptoms that are sever or concerning to you.  MAKE SURE YOU   Understand these instructions.  Will watch your condition.  Will get  help right away if you are not doing well or get worse.  Your e-visit answers were reviewed by a board certified advanced clinical practitioner to complete your personal care plan.  Depending on the condition, your plan could have included both over the counter or prescription medications.  If there is a problem please reply once you have received a response from your provider.  Your safety is important to us.  If you have drug allergies check your prescription carefully.    You can use MyChart to ask questions about today's visit, request a non-urgent call back, or ask for a work or school excuse for 24 hours related to this e-Visit. If it has been greater than 24 hours you will need to follow up with your provider, or enter a new e-Visit to address those concerns. You will get an e-mail in the next two days asking about your experience.  I hope that your e-visit has been valuable and will speed your recovery. Thank you for using e-visits.    

## 2019-04-27 ENCOUNTER — Encounter (INDEPENDENT_AMBULATORY_CARE_PROVIDER_SITE_OTHER): Payer: Self-pay

## 2019-04-28 ENCOUNTER — Encounter (INDEPENDENT_AMBULATORY_CARE_PROVIDER_SITE_OTHER): Payer: Self-pay

## 2019-04-28 ENCOUNTER — Telehealth: Payer: Self-pay

## 2019-04-28 NOTE — Telephone Encounter (Signed)
Patient has been advise on cough per protocol:    If cough remains the same or better: continue to treat with over the counter medications. Hard candy or cough drops and drinking warm fluids. Adults can also use honey 2 tsp (10 ML) at bedtime.   HONEY IS NOT RECOMMENDED FOR INFANTS UNDER ONE.   If cough is becoming worse even with the use of over the counter medications and patient is not able to sleep at night, cough becomes productive with sputum that maybe yellow or green in color, contact PCP.  Patient states that the cough is new and that she will continue to take medication and monitor

## 2019-05-22 ENCOUNTER — Other Ambulatory Visit: Payer: Self-pay | Admitting: Family Medicine

## 2019-05-22 DIAGNOSIS — Z1231 Encounter for screening mammogram for malignant neoplasm of breast: Secondary | ICD-10-CM

## 2019-05-27 ENCOUNTER — Other Ambulatory Visit: Payer: Self-pay

## 2019-05-27 ENCOUNTER — Ambulatory Visit
Admission: RE | Admit: 2019-05-27 | Discharge: 2019-05-27 | Disposition: A | Discharge status: Home / Self Care | Payer: BC Managed Care – PPO | Source: Ambulatory Visit

## 2019-05-27 DIAGNOSIS — Z1231 Encounter for screening mammogram for malignant neoplasm of breast: Secondary | ICD-10-CM

## 2020-01-09 ENCOUNTER — Other Ambulatory Visit: Payer: Self-pay

## 2020-01-09 ENCOUNTER — Encounter (HOSPITAL_COMMUNITY): Payer: Self-pay

## 2020-01-09 ENCOUNTER — Emergency Department (HOSPITAL_COMMUNITY): Payer: BC Managed Care – PPO

## 2020-01-09 ENCOUNTER — Inpatient Hospital Stay (HOSPITAL_COMMUNITY)
Admission: EM | Admit: 2020-01-09 | Discharge: 2020-01-29 | DRG: 207 | Disposition: E | Payer: BC Managed Care – PPO | Attending: Internal Medicine | Admitting: Internal Medicine

## 2020-01-09 DIAGNOSIS — I4891 Unspecified atrial fibrillation: Secondary | ICD-10-CM | POA: Diagnosis present

## 2020-01-09 DIAGNOSIS — E669 Obesity, unspecified: Secondary | ICD-10-CM | POA: Diagnosis present

## 2020-01-09 DIAGNOSIS — M7989 Other specified soft tissue disorders: Secondary | ICD-10-CM

## 2020-01-09 DIAGNOSIS — Z66 Do not resuscitate: Secondary | ICD-10-CM | POA: Diagnosis not present

## 2020-01-09 DIAGNOSIS — L89892 Pressure ulcer of other site, stage 2: Secondary | ICD-10-CM | POA: Diagnosis present

## 2020-01-09 DIAGNOSIS — J1282 Pneumonia due to coronavirus disease 2019: Secondary | ICD-10-CM | POA: Diagnosis present

## 2020-01-09 DIAGNOSIS — J9601 Acute respiratory failure with hypoxia: Secondary | ICD-10-CM

## 2020-01-09 DIAGNOSIS — J9690 Respiratory failure, unspecified, unspecified whether with hypoxia or hypercapnia: Secondary | ICD-10-CM

## 2020-01-09 DIAGNOSIS — Z91030 Bee allergy status: Secondary | ICD-10-CM

## 2020-01-09 DIAGNOSIS — G40A09 Absence epileptic syndrome, not intractable, without status epilepticus: Secondary | ICD-10-CM | POA: Diagnosis present

## 2020-01-09 DIAGNOSIS — E11649 Type 2 diabetes mellitus with hypoglycemia without coma: Secondary | ICD-10-CM | POA: Diagnosis not present

## 2020-01-09 DIAGNOSIS — Z515 Encounter for palliative care: Secondary | ICD-10-CM | POA: Diagnosis not present

## 2020-01-09 DIAGNOSIS — Z978 Presence of other specified devices: Secondary | ICD-10-CM

## 2020-01-09 DIAGNOSIS — R739 Hyperglycemia, unspecified: Secondary | ICD-10-CM | POA: Diagnosis not present

## 2020-01-09 DIAGNOSIS — R6510 Systemic inflammatory response syndrome (SIRS) of non-infectious origin without acute organ dysfunction: Secondary | ICD-10-CM | POA: Diagnosis not present

## 2020-01-09 DIAGNOSIS — L8990 Pressure ulcer of unspecified site, unspecified stage: Secondary | ICD-10-CM | POA: Insufficient documentation

## 2020-01-09 DIAGNOSIS — E661 Drug-induced obesity: Secondary | ICD-10-CM | POA: Diagnosis not present

## 2020-01-09 DIAGNOSIS — J80 Acute respiratory distress syndrome: Secondary | ICD-10-CM

## 2020-01-09 DIAGNOSIS — R0602 Shortness of breath: Secondary | ICD-10-CM

## 2020-01-09 DIAGNOSIS — E1165 Type 2 diabetes mellitus with hyperglycemia: Secondary | ICD-10-CM | POA: Diagnosis present

## 2020-01-09 DIAGNOSIS — I119 Hypertensive heart disease without heart failure: Secondary | ICD-10-CM | POA: Diagnosis present

## 2020-01-09 DIAGNOSIS — D6859 Other primary thrombophilia: Secondary | ICD-10-CM | POA: Diagnosis present

## 2020-01-09 DIAGNOSIS — Z6836 Body mass index (BMI) 36.0-36.9, adult: Secondary | ICD-10-CM | POA: Diagnosis not present

## 2020-01-09 DIAGNOSIS — E872 Acidosis: Secondary | ICD-10-CM | POA: Diagnosis present

## 2020-01-09 DIAGNOSIS — Z87442 Personal history of urinary calculi: Secondary | ICD-10-CM

## 2020-01-09 DIAGNOSIS — R7303 Prediabetes: Secondary | ICD-10-CM

## 2020-01-09 DIAGNOSIS — Z6838 Body mass index (BMI) 38.0-38.9, adult: Secondary | ICD-10-CM | POA: Diagnosis not present

## 2020-01-09 DIAGNOSIS — R578 Other shock: Secondary | ICD-10-CM | POA: Diagnosis not present

## 2020-01-09 DIAGNOSIS — I10 Essential (primary) hypertension: Secondary | ICD-10-CM | POA: Diagnosis not present

## 2020-01-09 DIAGNOSIS — U071 COVID-19: Secondary | ICD-10-CM | POA: Diagnosis not present

## 2020-01-09 DIAGNOSIS — R569 Unspecified convulsions: Secondary | ICD-10-CM

## 2020-01-09 DIAGNOSIS — E876 Hypokalemia: Secondary | ICD-10-CM | POA: Diagnosis present

## 2020-01-09 DIAGNOSIS — T797XXA Traumatic subcutaneous emphysema, initial encounter: Secondary | ICD-10-CM | POA: Diagnosis present

## 2020-01-09 DIAGNOSIS — T380X5A Adverse effect of glucocorticoids and synthetic analogues, initial encounter: Secondary | ICD-10-CM | POA: Diagnosis present

## 2020-01-09 DIAGNOSIS — Z7984 Long term (current) use of oral hypoglycemic drugs: Secondary | ICD-10-CM

## 2020-01-09 DIAGNOSIS — D696 Thrombocytopenia, unspecified: Secondary | ICD-10-CM | POA: Diagnosis present

## 2020-01-09 DIAGNOSIS — R197 Diarrhea, unspecified: Secondary | ICD-10-CM

## 2020-01-09 DIAGNOSIS — X58XXXA Exposure to other specified factors, initial encounter: Secondary | ICD-10-CM | POA: Diagnosis present

## 2020-01-09 DIAGNOSIS — R9431 Abnormal electrocardiogram [ECG] [EKG]: Secondary | ICD-10-CM

## 2020-01-09 DIAGNOSIS — I469 Cardiac arrest, cause unspecified: Secondary | ICD-10-CM | POA: Diagnosis present

## 2020-01-09 DIAGNOSIS — Z9911 Dependence on respirator [ventilator] status: Secondary | ICD-10-CM

## 2020-01-09 DIAGNOSIS — R0902 Hypoxemia: Secondary | ICD-10-CM

## 2020-01-09 DIAGNOSIS — Z79899 Other long term (current) drug therapy: Secondary | ICD-10-CM

## 2020-01-09 DIAGNOSIS — Z452 Encounter for adjustment and management of vascular access device: Secondary | ICD-10-CM

## 2020-01-09 DIAGNOSIS — J189 Pneumonia, unspecified organism: Secondary | ICD-10-CM

## 2020-01-09 DIAGNOSIS — R21 Rash and other nonspecific skin eruption: Secondary | ICD-10-CM | POA: Diagnosis not present

## 2020-01-09 DIAGNOSIS — Z793 Long term (current) use of hormonal contraceptives: Secondary | ICD-10-CM

## 2020-01-09 LAB — COMPREHENSIVE METABOLIC PANEL
ALT: 39 U/L (ref 0–44)
AST: 64 U/L — ABNORMAL HIGH (ref 15–41)
Albumin: 3.4 g/dL — ABNORMAL LOW (ref 3.5–5.0)
Alkaline Phosphatase: 47 U/L (ref 38–126)
Anion gap: 14 (ref 5–15)
BUN: 11 mg/dL (ref 6–20)
CO2: 22 mmol/L (ref 22–32)
Calcium: 8.5 mg/dL — ABNORMAL LOW (ref 8.9–10.3)
Chloride: 97 mmol/L — ABNORMAL LOW (ref 98–111)
Creatinine, Ser: 0.74 mg/dL (ref 0.44–1.00)
GFR calc Af Amer: >60 mL/min (ref >60)
GFR calc non Af Amer: >60 mL/min (ref >60)
Glucose, Bld: 132 mg/dL — ABNORMAL HIGH (ref 70–99)
Potassium: 3 mmol/L — ABNORMAL LOW (ref 3.5–5.1)
Sodium: 133 mmol/L — ABNORMAL LOW (ref 135–145)
Total Bilirubin: 0.7 mg/dL (ref 0.3–1.2)
Total Protein: 7.4 g/dL (ref 6.5–8.1)

## 2020-01-09 LAB — CBC WITH DIFFERENTIAL/PLATELET
Abs Immature Granulocytes: 0.02 10*3/uL (ref 0.00–0.07)
Basophils Absolute: 0 10*3/uL (ref 0.0–0.1)
Basophils Relative: 0 %
Eosinophils Absolute: 0 10*3/uL (ref 0.0–0.5)
Eosinophils Relative: 0 %
HCT: 45.6 % (ref 36.0–46.0)
Hemoglobin: 15.5 g/dL — ABNORMAL HIGH (ref 12.0–15.0)
Immature Granulocytes: 0 %
Lymphocytes Relative: 11 %
Lymphs Abs: 0.6 10*3/uL — ABNORMAL LOW (ref 0.7–4.0)
MCH: 29.4 pg (ref 26.0–34.0)
MCHC: 34 g/dL (ref 30.0–36.0)
MCV: 86.5 fL (ref 80.0–100.0)
Monocytes Absolute: 0.1 10*3/uL (ref 0.1–1.0)
Monocytes Relative: 2 %
Neutro Abs: 4.4 10*3/uL (ref 1.7–7.7)
Neutrophils Relative %: 87 %
Platelets: 110 10*3/uL — ABNORMAL LOW (ref 150–400)
RBC: 5.27 MIL/uL — ABNORMAL HIGH (ref 3.87–5.11)
RDW: 12.9 % (ref 11.5–15.5)
WBC: 5 10*3/uL (ref 4.0–10.5)
nRBC: 0 % (ref 0.0–0.2)

## 2020-01-09 LAB — LACTIC ACID, PLASMA
Lactic Acid, Venous: 1.2 mmol/L (ref 0.5–1.9)
Lactic Acid, Venous: 1.5 mmol/L (ref 0.5–1.9)

## 2020-01-09 LAB — MAGNESIUM
Magnesium: 1.9 mg/dL (ref 1.7–2.4)
Magnesium: 2 mg/dL (ref 1.7–2.4)

## 2020-01-09 LAB — PHOSPHORUS: Phosphorus: 3.1 mg/dL (ref 2.5–4.6)

## 2020-01-09 LAB — C-REACTIVE PROTEIN: CRP: 18.9 mg/dL — ABNORMAL HIGH (ref <1.0)

## 2020-01-09 LAB — TRIGLYCERIDES: Triglycerides: 67 mg/dL (ref <150)

## 2020-01-09 LAB — SARS CORONAVIRUS 2 BY RT PCR (HOSPITAL ORDER, PERFORMED IN ~~LOC~~ HOSPITAL LAB): SARS Coronavirus 2: POSITIVE — AB

## 2020-01-09 LAB — PROCALCITONIN: Procalcitonin: <0.1 ng/mL

## 2020-01-09 LAB — D-DIMER, QUANTITATIVE: D-Dimer, Quant: 0.9 ug/mL-FEU — ABNORMAL HIGH (ref 0.00–0.50)

## 2020-01-09 LAB — LACTATE DEHYDROGENASE: LDH: 371 U/L — ABNORMAL HIGH (ref 98–192)

## 2020-01-09 LAB — FERRITIN: Ferritin: 569 ng/mL — ABNORMAL HIGH (ref 11–307)

## 2020-01-09 LAB — FIBRINOGEN: Fibrinogen: 555 mg/dL — ABNORMAL HIGH (ref 210–475)

## 2020-01-09 MED ORDER — SODIUM CHLORIDE 0.9 % IV BOLUS
500.0000 mL | Freq: Once | INTRAVENOUS | Status: AC
  Administered 2020-01-09: 500 mL via INTRAVENOUS

## 2020-01-09 MED ORDER — ZINC SULFATE 220 (50 ZN) MG PO CAPS
220.0000 mg | ORAL_CAPSULE | Freq: Every day | ORAL | Status: DC
  Administered 2020-01-09 – 2020-01-15 (×7): 220 mg via ORAL
  Filled 2020-01-09 (×7): qty 1

## 2020-01-09 MED ORDER — HYDROCOD POLST-CPM POLST ER 10-8 MG/5ML PO SUER
5.0000 mL | Freq: Two times a day (BID) | ORAL | Status: DC | PRN
  Administered 2020-01-10 – 2020-01-14 (×7): 5 mL via ORAL
  Filled 2020-01-09 (×7): qty 5

## 2020-01-09 MED ORDER — LISINOPRIL-HYDROCHLOROTHIAZIDE 10-12.5 MG PO TABS: 1.0000 | ORAL_TABLET | Freq: Every day | ORAL | Status: DC

## 2020-01-09 MED ORDER — POTASSIUM CHLORIDE CRYS ER 20 MEQ PO TBCR
40.0000 meq | EXTENDED_RELEASE_TABLET | Freq: Once | ORAL | Status: AC
  Administered 2020-01-09: 40 meq via ORAL
  Filled 2020-01-09: qty 2

## 2020-01-09 MED ORDER — ACETAMINOPHEN 500 MG PO TABS
1000.0000 mg | ORAL_TABLET | Freq: Once | ORAL | Status: AC
  Administered 2020-01-09: 1000 mg via ORAL
  Filled 2020-01-09: qty 2

## 2020-01-09 MED ORDER — SODIUM CHLORIDE 0.9 % IV SOLN
100.0000 mg | Freq: Once | INTRAVENOUS | Status: AC
  Administered 2020-01-09: 100 mg via INTRAVENOUS
  Filled 2020-01-09: qty 20

## 2020-01-09 MED ORDER — ASCORBIC ACID 500 MG PO TABS
500.0000 mg | ORAL_TABLET | Freq: Every day | ORAL | Status: DC
  Administered 2020-01-09 – 2020-01-15 (×7): 500 mg via ORAL
  Filled 2020-01-09 (×9): qty 1

## 2020-01-09 MED ORDER — SODIUM CHLORIDE 0.9 % IV SOLN
100.0000 mg | Freq: Every day | INTRAVENOUS | Status: AC
  Administered 2020-01-11 – 2020-01-13 (×3): 100 mg via INTRAVENOUS
  Filled 2020-01-09 (×4): qty 20

## 2020-01-09 MED ORDER — LIDOCAINE 5 % EX PTCH
1.0000 | MEDICATED_PATCH | CUTANEOUS | Status: DC
  Administered 2020-01-09 – 2020-01-19 (×11): 1 via TRANSDERMAL
  Filled 2020-01-09 (×13): qty 1

## 2020-01-09 MED ORDER — METHYLPREDNISOLONE SODIUM SUCC 125 MG IJ SOLR
60.0000 mg | Freq: Two times a day (BID) | INTRAMUSCULAR | Status: DC
  Administered 2020-01-09 – 2020-01-14 (×10): 60 mg via INTRAVENOUS
  Filled 2020-01-09 (×10): qty 2

## 2020-01-09 MED ORDER — POTASSIUM CHLORIDE 10 MEQ/100ML IV SOLN
10.0000 meq | INTRAVENOUS | Status: AC
  Administered 2020-01-09: 10 meq via INTRAVENOUS
  Filled 2020-01-09 (×2): qty 100

## 2020-01-09 MED ORDER — MAGNESIUM SULFATE IN D5W 1-5 GM/100ML-% IV SOLN
1.0000 g | Freq: Once | INTRAVENOUS | Status: AC
  Administered 2020-01-09: 1 g via INTRAVENOUS
  Filled 2020-01-09: qty 100

## 2020-01-09 MED ORDER — PANTOPRAZOLE SODIUM 40 MG PO TBEC
40.0000 mg | DELAYED_RELEASE_TABLET | Freq: Every day | ORAL | Status: DC
  Administered 2020-01-09 – 2020-01-15 (×7): 40 mg via ORAL
  Filled 2020-01-09 (×7): qty 1

## 2020-01-09 MED ORDER — PROCHLORPERAZINE EDISYLATE 10 MG/2ML IJ SOLN
10.0000 mg | Freq: Four times a day (QID) | INTRAMUSCULAR | Status: DC | PRN
  Administered 2020-01-09: 10 mg via INTRAVENOUS
  Filled 2020-01-09 (×2): qty 2

## 2020-01-09 MED ORDER — HYDROCHLOROTHIAZIDE 12.5 MG PO CAPS
12.5000 mg | ORAL_CAPSULE | Freq: Every day | ORAL | Status: DC
  Administered 2020-01-10: 12.5 mg via ORAL
  Filled 2020-01-09 (×2): qty 1

## 2020-01-09 MED ORDER — POTASSIUM CHLORIDE CRYS ER 20 MEQ PO TBCR: 40.0000 meq | EXTENDED_RELEASE_TABLET | Freq: Once | ORAL | Status: DC

## 2020-01-09 MED ORDER — ENOXAPARIN SODIUM 60 MG/0.6ML ~~LOC~~ SOLN
55.0000 mg | SUBCUTANEOUS | Status: DC
  Administered 2020-01-09 – 2020-01-16 (×8): 55 mg via SUBCUTANEOUS
  Filled 2020-01-09 (×9): qty 0.6

## 2020-01-09 MED ORDER — LEVETIRACETAM 250 MG PO TABS
250.0000 mg | ORAL_TABLET | Freq: Two times a day (BID) | ORAL | Status: DC
  Administered 2020-01-09 – 2020-01-15 (×12): 250 mg via ORAL
  Filled 2020-01-09 (×14): qty 1

## 2020-01-09 MED ORDER — GUAIFENESIN-DM 100-10 MG/5ML PO SYRP
5.0000 mL | ORAL_SOLUTION | ORAL | Status: DC | PRN
  Administered 2020-01-11 – 2020-01-14 (×8): 5 mL via ORAL
  Filled 2020-01-09 (×8): qty 5

## 2020-01-09 MED ORDER — SODIUM CHLORIDE 0.9 % IV SOLN
100.0000 mg | Freq: Once | INTRAVENOUS | Status: AC
  Administered 2020-01-09: 100 mg via INTRAVENOUS

## 2020-01-09 MED ORDER — ZONISAMIDE 100 MG PO CAPS
200.0000 mg | ORAL_CAPSULE | Freq: Two times a day (BID) | ORAL | Status: DC
  Administered 2020-01-09 – 2020-01-15 (×12): 200 mg via ORAL
  Filled 2020-01-09 (×18): qty 2

## 2020-01-09 MED ORDER — LISINOPRIL 10 MG PO TABS
10.0000 mg | ORAL_TABLET | Freq: Every day | ORAL | Status: DC
  Administered 2020-01-10: 10 mg via ORAL
  Filled 2020-01-09 (×2): qty 1

## 2020-01-09 MED ORDER — ENOXAPARIN SODIUM 40 MG/0.4ML ~~LOC~~ SOLN: 40.0000 mg | SUBCUTANEOUS | Status: DC

## 2020-01-09 MED ORDER — POTASSIUM CHLORIDE 10 MEQ/100ML IV SOLN
10.0000 meq | Freq: Once | INTRAVENOUS | Status: AC
  Administered 2020-01-09: 10 meq via INTRAVENOUS
  Filled 2020-01-09: qty 100

## 2020-01-09 NOTE — ED Triage Notes (Signed)
EMS reports pt covid positive and c/o cough and sob.  EMS says o2 sat 93% on 6liters.  Reports room air o2 sat 89%.

## 2020-01-09 NOTE — ED Provider Notes (Signed)
[facility] EMERGENCY DEPARTMENT Provider Note   CSN: [id number] Arrival date & time: 2020/01/21  1708     History Chief Complaint  Patient presents with   covid    Jamie Knapp is a 44 y.o. female.  HPI   44 year old female with a history of deviated nasal septum, nephrolithiasis, seizures, sinusitis, who presents to the emergency department today for evaluation of shortness of breath.  Patient became symptomatic with Covid 1 week ago.  She has been experiencing fevers, body aches, cough and shortness of breath.  She has been taking over-the-counter antipyretics and cough medication without significant relief.  She also reports diarrhea.  She has had one dose of the vaccine.   Past Medical History:  Diagnosis Date   Deviated nasal septum 10/2015   History of kidney stones 09/2014   Seizures (HCC)    petit-mal; last seizure 05/2015   Sinusitis 10/2015    Patient Active Problem List   Diagnosis Date Noted   Leukocytosis 12/22/2013   Yeast infection of the skin 12/22/2013    Past Surgical History:  Procedure Laterality Date   NASAL SINUS SURGERY Bilateral 02/20/2016   Procedure: BILATERAL ENDOSCOPIC MAXILLARY ANTROSTOMY;  Surgeon: Newman Pies, MD;  Location: [facility];  Service: ENT;  Laterality: Bilateral;   SEPTOPLASTY N/A 02/20/2016   Procedure: SEPTOPLASTY;  Surgeon: Newman Pies, MD;  Location: [facility];  Service: ENT;  Laterality: N/A;   WISDOM TOOTH EXTRACTION       OB History   No obstetric history on file.     Family History  Problem Relation Age of Onset   Cancer Paternal Grandmother        female cancer    Social History   Tobacco Use   Smoking status: Never Smoker   Smokeless tobacco: Never Used  Substance Use Topics   Alcohol use: No   Drug use: No    Home Medications Prior to Admission medications   Medication Sig Start Date End Date Taking? Authorizing Provider  benzonatate (TESSALON) 200 MG  capsule Take 200 mg by mouth 3 (three) times daily as needed. 01/08/20  Yes [provider]  desogestrel-ethinyl estradiol (KARIVA) 0.15-0.02/0.01 MG (21/5) tablet Take 1 tablet by mouth daily.   Yes [provider]  levETIRAcetam (KEPPRA) 250 MG tablet Take 250 mg by mouth 2 (two) times daily.   Yes [provider]  lisinopril-hydrochlorothiazide (ZESTORETIC) 10-12.5 MG tablet Take 1 tablet by mouth daily. 10/21/19  Yes [provider]  metFORMIN (GLUCOPHAGE-XR) 500 MG 24 hr tablet Take 500 mg by mouth 2 (two) times daily. 01/04/20  Yes [provider]  Multiple Vitamin (MULTIVITAMIN) tablet Take 1 tablet by mouth daily.   Yes [provider]  zonisamide (ZONEGRAN) 100 MG capsule Take 200 mg by mouth 2 (two) times daily.   Yes [provider]    Allergies    Bee venom  Review of Systems   Review of Systems  Constitutional: Positive for chills, diaphoresis and fever.  HENT: Negative for ear pain and sore throat.   Eyes: Negative for visual disturbance.  Respiratory: Positive for cough and shortness of breath.   Cardiovascular: Positive for chest pain (chest wall).  Gastrointestinal: Positive for diarrhea and vomiting (post tussive emesis). Negative for abdominal pain and nausea.  Genitourinary: Negative for dysuria and hematuria.  Musculoskeletal: Positive for myalgias.  Skin: Negative for rash.  Neurological: Negative for seizures and syncope.  All other systems reviewed and are negative.  Physical Exam Updated Vital Signs BP 116/67    Pulse (!) 116    Temp 99.8 F (37.7 C) (Oral)    Resp (!) 29    Ht 5\' 7"  (1.702 m)    Wt 111.1 kg    SpO2 97%    BMI 38.37 kg/m   Physical Exam Vitals and nursing note reviewed.  Constitutional:      General: She is not in acute distress.    Appearance: She is well-developed.  HENT:     Head: Normocephalic and atraumatic.     Mouth/Throat:     Mouth: Mucous membranes are dry.  Eyes:       Conjunctiva/sclera: Conjunctivae normal.  Cardiovascular:     Rate and Rhythm: Regular rhythm. Tachycardia present.     Heart sounds: No murmur heard.   Pulmonary:     Breath sounds: Rales present. No wheezing or rhonchi.     Comments: tachypnea Abdominal:     General: Bowel sounds are normal.     Palpations: Abdomen is soft.     Tenderness: There is no abdominal tenderness. There is no guarding or rebound.  Musculoskeletal:     Cervical back: Neck supple.  Skin:    General: Skin is warm and dry.  Neurological:     Mental Status: She is alert.     ED Results / Procedures / Treatments   Labs (all labs ordered are listed, but only abnormal results are displayed) Labs Reviewed  SARS CORONAVIRUS 2 BY RT PCR (HOSPITAL ORDER, PERFORMED IN [facility]) - Abnormal; Notable for the following components:      Result Value   SARS Coronavirus 2 POSITIVE (*)    All other components within normal limits  CBC WITH DIFFERENTIAL/PLATELET - Abnormal; Notable for the following components:   RBC 5.27 (*)    Hemoglobin 15.5 (*)    Platelets 110 (*)    Lymphs Abs 0.6 (*)    All other components within normal limits  COMPREHENSIVE METABOLIC PANEL - Abnormal; Notable for the following components:   Sodium 133 (*)    Potassium 3.0 (*)    Chloride 97 (*)    Glucose, Bld 132 (*)    Calcium 8.5 (*)    Albumin 3.4 (*)    AST 64 (*)    All other components within normal limits  D-DIMER, QUANTITATIVE (NOT AT [facility]) - Abnormal; Notable for the following components:   D-Dimer, Quant 0.90 (*)    All other components within normal limits  LACTATE DEHYDROGENASE - Abnormal; Notable for the following components:   LDH 371 (*)    All other components within normal limits  FERRITIN - Abnormal; Notable for the following components:   Ferritin 569 (*)    All other components within normal limits  FIBRINOGEN - Abnormal; Notable for the following components:   Fibrinogen 555 (*)    All  other components within normal limits  C-REACTIVE PROTEIN - Abnormal; Notable for the following components:   CRP 18.9 (*)    All other components within normal limits  CULTURE, BLOOD (ROUTINE X 2)  CULTURE, BLOOD (ROUTINE X 2)  LACTIC ACID, PLASMA  PROCALCITONIN  TRIGLYCERIDES  MAGNESIUM  LACTIC ACID, PLASMA  POC URINE PREG, ED    EKG EKG Interpretation  Date/Time:  Saturday February 03, 2020 17:16:29 EDT Ventricular Rate:  123 PR Interval:    QRS Duration: 80 QT Interval:  412 QTC Calculation: 590 R Axis:   61 Text Interpretation: Sinus  tachycardia Low voltage, precordial leads Borderline repolarization abnormality Prolonged QT interval No old tracing to compare Confirmed by Jacalyn Lefevre [phone]) on 01/08/2020 5:21:15 PM   Radiology DG Chest Port 1 View  Result Date: 01/13/2020 CLINICAL DATA:  44 year old with cough, shortness of breath and hypoxia. COVID-19 positive. EXAM: PORTABLE CHEST 1 VIEW COMPARISON:  None. FINDINGS: Cardiac silhouette normal in size for AP portable technique. Patchy and confluent airspace opacities throughout both lungs. Pulmonary vascularity normal. No pleural effusions. IMPRESSION: Acute multilobar pneumonia involving both lungs. Electronically Signed   By: Hulan Saas M.D.   On: 01/27/2020 18:55    Procedures Procedures (including critical care time)  CRITICAL CARE Performed by: Karrie Meres   Total critical care time: 31 minutes  Critical care time was exclusive of separately billable procedures and treating other patients.  Critical care was necessary to treat or prevent imminent or life-threatening deterioration.  Critical care was time spent personally by me on the following activities: development of treatment plan with patient and/or surrogate as well as nursing, discussions with consultants, evaluation of patient's response to treatment, examination of patient, obtaining history from patient or surrogate, ordering and  performing treatments and interventions, ordering and review of laboratory studies, ordering and review of radiographic studies, pulse oximetry and re-evaluation of patient's condition.   Medications Ordered in ED Medications  potassium chloride 10 mEq in 100 mL IVPB (10 mEq Intravenous New Bag/Given 01/25/2020 1938)  sodium chloride 0.9 % bolus 500 mL (0 mLs Intravenous Stopped 01/01/2020 1934)  potassium chloride SA (KLOR-CON) CR tablet 40 mEq (40 mEq Oral Given 01/27/2020 1934)  acetaminophen (TYLENOL) tablet 1,000 mg (1,000 mg Oral Given 01/15/2020 1941)    ED Course  I have reviewed the triage vital signs and the nursing notes.  Pertinent labs & imaging results that were available during my care of the patient were reviewed by me and considered in my medical decision making (see chart for details).    MDM Rules/Calculators/A&P                          44 y/o F presenting to the ED today for eval of sob in setting of covid. Became symptomatic 1 week ago.   Reviewed/interpreted labs CBC with elevated hemoglobin likely due to dehydration, mild, cytopenia CMP with mild hypokalemia, hypochloremia and hyponatremia,. Cr wnl. ast elevated  - po and iv k given, qtc somewhat prolonged Mg wnl Inflammatory markers elevated Procalcitonin neg Urine preg pending Covid test positive Lactic acid neg Blood cultures obtained.   EKG with Sinus tachycardia Low voltage, precordial leads Borderline repolarization abnormality Prolonged QT interval No old tracing to compare  CXR reviewed/interpreted - Acute multilobar pneumonia involving both lungs.   Pt with COVID with acute hypoxic respiratory failure requiring 4l o2.   7:46 PM CONSULT with Dr. Thomes Dinning who accepts patient for admission.   Final Clinical Impression(s) / ED Diagnoses Final diagnoses:  COVID-19  Acute respiratory failure with hypoxia [facility])    Rx / DC Orders ED Discharge Orders    None       Rayne Du 01/14/2020  1949    Jacalyn Lefevre, MD 01/11/2020 2322

## 2020-01-09 NOTE — H&P (Signed)
History and Physical  Jamie Knapp SLH:[id number] DOB: 1976/03/30 DOA: 01/03/2020  Referring physician: Karrie Meres, PA-C PCP: Gweneth Dimitri, MD  Patient coming from: Home  Chief Complaint: Shortness of breath  HPI: Jamie Knapp is a 44 y.o. female with medical history significant for hypertension and seizures who presents to the emergency department due to shortness of breath that started earlier today.  Patient complained of 1 week onset of nonproductive cough, fever, body aches and diarrhea, she got tested for Covid on Sunday (01/03/2020) and was positive.  She has been taking over-the-counter antipyretics and cough suppressant without relief.  Patient states that she woke up this afternoon being short of breath, so she checked her pulse ox with home pulse oximeter and noted that the O2 sat was in the low 80s.  She activated EMS, on arrival of EMS, O2 sat was checked and was 89%, patient was taken to the ED for further evaluation management.  Patient endorsed loss of taste, she was unsure of loss of sense of smell.  Patient states that she had 1 dose of Pfizer vaccine already.  ED Course: In the emergency department, she was tachycardic and tachypneic.  O2 sat was 96% on supplemental oxygen 4 LPM.  Work-up in the ED showed thrombocytopenia, hypokalemia, hyperglycemia, ferritin 569, CRP 18.9, AST 64, LDH 371, fibrinogen 555, D-dimer 0.90.  SARS coronavirus 2 was positive.  Chest x-ray showed acute multilobar pneumonia involving both lungs.  Potassium was replenished and hospitalist was asked to admit patient for further evaluation and management.  Review of Systems: Constitutional: Positive for chills and fever.  HENT: Negative for ear pain and sore throat.   Eyes: Negative for pain and visual disturbance.  Respiratory: Positive for cough and shortness of breath.   Cardiovascular: Positive for chest soreness (from coughing).    Gastrointestinal: Positive for nonbloody  vomiting (posttussive) and diarrhea.  Negative for abdominal pain.  Endocrine: Negative for polyphagia and polyuria.  Genitourinary: Negative for decreased urine volume, dysuria Musculoskeletal: Positive for muscle pain.  Skin: Negative for color change and rash.  Allergic/Immunologic: Negative for immunocompromised state.  Neurological: Negative for tremors, syncope, speech difficulty, weakness, light-headedness and headaches.  Hematological: Does not bruise/bleed easily.  All other systems reviewed and are negative   Past Medical History:  Diagnosis Date  . Deviated nasal septum 10/2015  . History of kidney stones 09/2014  . Seizures (HCC)    petit-mal; last seizure 05/2015  . Sinusitis 10/2015   Past Surgical History:  Procedure Laterality Date  . NASAL SINUS SURGERY Bilateral 02/20/2016   Procedure: BILATERAL ENDOSCOPIC MAXILLARY ANTROSTOMY;  Surgeon: Newman Pies, MD;  Location: [facility];  Service: ENT;  Laterality: Bilateral;  . SEPTOPLASTY N/A 02/20/2016   Procedure: SEPTOPLASTY;  Surgeon: Newman Pies, MD;  Location: [facility];  Service: ENT;  Laterality: N/A;  . WISDOM TOOTH EXTRACTION      Social History:  reports that she has never smoked. She has never used smokeless tobacco. She reports that she does not drink alcohol and does not use drugs.   Allergies  Allergen Reactions  . Bee Venom Swelling    Family History  Problem Relation Age of Onset  . Cancer Paternal Grandmother        female cancer    Prior to Admission medications   Medication Sig Start Date End Date Taking? Authorizing Provider  benzonatate (TESSALON) 200 MG capsule Take 200 mg by mouth 3 (three) times daily as needed.  01/08/20  Yes [provider]  desogestrel-ethinyl estradiol (KARIVA) 0.15-0.02/0.01 MG (21/5) tablet Take 1 tablet by mouth daily.   Yes [provider]  levETIRAcetam (KEPPRA) 250 MG tablet Take 250 mg by mouth 2 (two) times daily.   Yes  [provider]  lisinopril-hydrochlorothiazide (ZESTORETIC) 10-12.5 MG tablet Take 1 tablet by mouth daily. 10/21/19  Yes [provider]  metFORMIN (GLUCOPHAGE-XR) 500 MG 24 hr tablet Take 500 mg by mouth 2 (two) times daily. 01/04/20  Yes [provider]  Multiple Vitamin (MULTIVITAMIN) tablet Take 1 tablet by mouth daily.   Yes [provider]  zonisamide (ZONEGRAN) 100 MG capsule Take 200 mg by mouth 2 (two) times daily.   Yes [provider]    Physical Exam: BP 121/73   Pulse (!) 117   Temp 99.8 F (37.7 C) (Oral)   Resp (!) 35   Ht 5\' 7"  (1.702 m)   Wt 111.1 kg   SpO2 (!) 89%   BMI 38.37 kg/m   . General: 44 y.o. year-old female well developed well nourished in no acute distress.  Alert and oriented x3. Marland Kitchen. HEENT: Dry mucous membrane.  NCAT, EOMI . Neck: Supple, trachea medial . Cardiovascular: Tachycardia.  Regular rate and rhythm with no rubs or gallops.  No thyromegaly or JVD noted.  No lower extremity edema. 2/4 pulses in all 4 extremities. Marland Kitchen. Respiratory: Tachypnea.  Clear to auscultation with no wheezes or rales. . Abdomen: Soft nontender nondistended with normal bowel sounds x4 quadrants. . Muskuloskeletal: No cyanosis, clubbing or edema noted bilaterally . Neuro: CN II-XII intact, strength, sensation, reflexes . Skin: No ulcerative lesions noted or rashes . Psychiatry: Judgement and insight appear normal. Mood is appropriate for condition and setting          Labs on Admission:  Basic Metabolic Panel: Recent Labs  Lab 09-14-2019 1820 09-14-2019 1821 09-14-2019 1830  NA 133*  --   --   K 3.0*  --   --   CL 97*  --   --   CO2 22  --   --   GLUCOSE 132*  --   --   BUN 11  --   --   CREATININE 0.74  --   --   CALCIUM 8.5*  --   --   MG  --  1.9 2.0  PHOS  --   --  3.1   Liver Function Tests: Recent Labs  Lab 09-14-2019 1820  AST 64*  ALT 39  ALKPHOS 47  BILITOT 0.7  PROT 7.4  ALBUMIN 3.4*   No results for  input(s): LIPASE, AMYLASE in the last 168 hours. No results for input(s): AMMONIA in the last 168 hours. CBC: Recent Labs  Lab 09-14-2019 1820  WBC 5.0  NEUTROABS 4.4  HGB 15.5*  HCT 45.6  MCV 86.5  PLT 110*   Cardiac Enzymes: No results for input(s): CKTOTAL, CKMB, CKMBINDEX, TROPONINI in the last 168 hours.  BNP (last 3 results) No results for input(s): BNP in the last 8760 hours.  ProBNP (last 3 results) No results for input(s): PROBNP in the last 8760 hours.  CBG: No results for input(s): GLUCAP in the last 168 hours.  Radiological Exams on Admission: DG Chest Port 1 View  Result Date: 30-Sep-2019 CLINICAL DATA:  44 year old with cough, shortness of breath and hypoxia. COVID-19 positive. EXAM: PORTABLE CHEST 1 VIEW COMPARISON:  None. FINDINGS: Cardiac silhouette normal in size for AP portable technique. Patchy and confluent airspace  opacities throughout both lungs. Pulmonary vascularity normal. No pleural effusions. IMPRESSION: Acute multilobar pneumonia involving both lungs. Electronically Signed   By: Hulan Saas M.D.   On: 01/10/2020 18:55    EKG: I independently viewed the EKG done and my findings are as followed: Sinus tachycardia at rate of 123 bpm with prolonged QTc ( )  Assessment/Plan Present on Admission: . Pneumonia due to COVID-19 virus  Principal Problem:   Pneumonia due to COVID-19 virus Active Problems:   Acute respiratory failure with hypoxia (HCC)   Thrombocytopenia (HCC)   Hypokalemia   SIRS (systemic inflammatory response syndrome) (HCC)   Essential hypertension   Seizures (HCC)   Obesity (BMI 30-39.9)   Borderline type 2 diabetes mellitus   Hyperglycemia   Prolonged QT interval   Diarrhea   Acute respiratory failure with hypoxia secondary to COVID-19 virus pneumonia Patient was hypoxic and requiring supplemental oxygen via  (patient does not use supplemental oxygen at baseline) SARS coronavirus 2 was positive Chest x-ray showed  acute multilobar pneumonia involving both lungs (procalcitonin <0.10) Continue albuterol q.6h Continue IV Solu-Medrol 60 mg twice daily Continue IV Remdesivir per pharmacy protocol Continue vitamin-C 500 mg p.o. Daily Continue zinc 220 mg p.o. Daily Continue Mucinex, Robitussin and Tussionex Continue Tylenol p.r.n. for fever Continue supplemental oxygen to maintain O2 sat > or = 94% with plan to wean patient off supplemental oxygen as tolerated (of note, patient does not use oxygen at baseline) Continue incentive spirometry and flutter valve q33min as tolerated Encourage proning, early ambulation, and side laying as tolerated Continue airborne isolation precaution Inflammatory markers: LDH: 371 CRP: 18.9 D-dimer: 0.90 Ferritin: 569 Fibrinogen: 555 Continue monitoring daily inflammatory markers Physician PPE:  Surgical mask with face shield, N-95, nonsterile gloves, disposable gown, head and shoe cover s Patient PPE:  Face mask   Hypokalemia K+ 3.3, this was replenished  SIRS positive Patient was tachycardic and tachypneic due to being hypoxic secondary to COVID-19 virus pneumonia.  This was not due to sepsis.  Sepsis ruled out  Hyperglycemia possible secondary to history of borderline type II DM Blood glucose 132, patient states that she has not needed to use home Metformin Hemoglobin A1c will be checked with plan to place patient on sliding scale insulin based on findings  Thrombocytopenia Platelets 110, no sign of bleeding Continue to monitor platelet level.  Prolonged QT interval QTc Avoid QT prolonging drugs K+ 3.3, this was replenished Magnesium level 1.9, this will be replenished for the level 2B above 2  Repeat EKG in the morning  Acute diarrhea C. difficile was done and was negative Imodium was given.  Essential hypertension Continue home lisinopril and HCTZ  Seizures Continue home Keppra and zonisamide  Obesity (BMI 30.37) Patient will be counseled  for diet control, exercise regimen and weight loss when more stable  DVT prophylaxis: Lovenox  Code Status: Full code  Family Communication: None at bedside  Disposition Plan:  Patient is from:                        home Anticipated DC to:                   SNF or family members home Anticipated DC date:               2-3 days Anticipated DC barriers:         Patient unstable to discharge due to being hypoxic and requiring supplemental oxygen due to  COVID-19 virus pneumonia that requires IV COVID-19 treatment   Consults called: None  Admission status: Inpatient  Frankey Shown MD [facility]  If 7PM-7AM, please contact night-coverage [email or web address] Password [email or web address]  01/10/2020, 1:31 AM

## 2020-01-09 NOTE — ED Notes (Signed)
Date and time results received: 05-Feb-2020 1931  Test: COVID Critical Value: POSITIVE  Name of Provider Notified: Particia Nearing, MD

## 2020-01-09 NOTE — ED Notes (Signed)
AC contacted for main pharmacy medication.

## 2020-01-10 DIAGNOSIS — R197 Diarrhea, unspecified: Secondary | ICD-10-CM

## 2020-01-10 LAB — C DIFFICILE QUICK SCREEN W PCR REFLEX
C Diff antigen: NEGATIVE
C Diff interpretation: NOT DETECTED
C Diff toxin: NEGATIVE

## 2020-01-10 LAB — MAGNESIUM: Magnesium: 2.2 mg/dL (ref 1.7–2.4)

## 2020-01-10 LAB — COMPREHENSIVE METABOLIC PANEL
ALT: 46 U/L — ABNORMAL HIGH (ref 0–44)
AST: 77 U/L — ABNORMAL HIGH (ref 15–41)
Albumin: 3.3 g/dL — ABNORMAL LOW (ref 3.5–5.0)
Alkaline Phosphatase: 45 U/L (ref 38–126)
Anion gap: 12 (ref 5–15)
BUN: 12 mg/dL (ref 6–20)
CO2: 19 mmol/L — ABNORMAL LOW (ref 22–32)
Calcium: 7.9 mg/dL — ABNORMAL LOW (ref 8.9–10.3)
Chloride: 100 mmol/L (ref 98–111)
Creatinine, Ser: 0.76 mg/dL (ref 0.44–1.00)
GFR calc Af Amer: >60 mL/min (ref >60)
GFR calc non Af Amer: >60 mL/min (ref >60)
Glucose, Bld: 229 mg/dL — ABNORMAL HIGH (ref 70–99)
Potassium: 3.6 mmol/L (ref 3.5–5.1)
Sodium: 131 mmol/L — ABNORMAL LOW (ref 135–145)
Total Bilirubin: 0.4 mg/dL (ref 0.3–1.2)
Total Protein: 7.1 g/dL (ref 6.5–8.1)

## 2020-01-10 LAB — CBC WITH DIFFERENTIAL/PLATELET
Abs Immature Granulocytes: 0.02 10*3/uL (ref 0.00–0.07)
Basophils Absolute: 0 10*3/uL (ref 0.0–0.1)
Basophils Relative: 0 %
Eosinophils Absolute: 0 10*3/uL (ref 0.0–0.5)
Eosinophils Relative: 0 %
HCT: 46.3 % — ABNORMAL HIGH (ref 36.0–46.0)
Hemoglobin: 15.5 g/dL — ABNORMAL HIGH (ref 12.0–15.0)
Immature Granulocytes: 0 %
Lymphocytes Relative: 8 %
Lymphs Abs: 0.4 10*3/uL — ABNORMAL LOW (ref 0.7–4.0)
MCH: 29.4 pg (ref 26.0–34.0)
MCHC: 33.5 g/dL (ref 30.0–36.0)
MCV: 87.7 fL (ref 80.0–100.0)
Monocytes Absolute: 0 10*3/uL — ABNORMAL LOW (ref 0.1–1.0)
Monocytes Relative: 1 %
Neutro Abs: 4 10*3/uL (ref 1.7–7.7)
Neutrophils Relative %: 91 %
Platelets: 110 10*3/uL — ABNORMAL LOW (ref 150–400)
RBC: 5.28 MIL/uL — ABNORMAL HIGH (ref 3.87–5.11)
RDW: 13 % (ref 11.5–15.5)
WBC: 4.5 10*3/uL (ref 4.0–10.5)
nRBC: 0 % (ref 0.0–0.2)

## 2020-01-10 LAB — FERRITIN: Ferritin: 819 ng/mL — ABNORMAL HIGH (ref 11–307)

## 2020-01-10 LAB — PROTIME-INR
INR: 1 (ref 0.8–1.2)
Prothrombin Time: 13.1 seconds (ref 11.4–15.2)

## 2020-01-10 LAB — HIV ANTIBODY (ROUTINE TESTING W REFLEX): HIV Screen 4th Generation wRfx: NONREACTIVE

## 2020-01-10 LAB — C-REACTIVE PROTEIN: CRP: 20.3 mg/dL — ABNORMAL HIGH (ref <1.0)

## 2020-01-10 LAB — CBG MONITORING, ED: Glucose-Capillary: 186 mg/dL — ABNORMAL HIGH (ref 70–99)

## 2020-01-10 LAB — APTT: aPTT: 39 seconds — ABNORMAL HIGH (ref 24–36)

## 2020-01-10 LAB — HEMOGLOBIN A1C
Hgb A1c MFr Bld: 6.2 % — ABNORMAL HIGH (ref 4.8–5.6)
Mean Plasma Glucose: 131.24 mg/dL

## 2020-01-10 LAB — D-DIMER, QUANTITATIVE: D-Dimer, Quant: 0.73 ug/mL-FEU — ABNORMAL HIGH (ref 0.00–0.50)

## 2020-01-10 MED ORDER — LOPERAMIDE HCL 2 MG PO CAPS
2.0000 mg | ORAL_CAPSULE | Freq: Once | ORAL | Status: AC
  Administered 2020-01-10: 2 mg via ORAL
  Filled 2020-01-10: qty 1

## 2020-01-10 MED ORDER — LABETALOL HCL 5 MG/ML IV SOLN
10.0000 mg | INTRAVENOUS | Status: DC | PRN
  Administered 2020-01-15: 10 mg via INTRAVENOUS
  Filled 2020-01-10: qty 4

## 2020-01-10 MED ORDER — POTASSIUM CHLORIDE CRYS ER 20 MEQ PO TBCR
40.0000 meq | EXTENDED_RELEASE_TABLET | Freq: Once | ORAL | Status: AC
  Administered 2020-01-10: 40 meq via ORAL
  Filled 2020-01-10: qty 2

## 2020-01-10 MED ORDER — POTASSIUM CHLORIDE IN NACL 40-0.9 MEQ/L-% IV SOLN
INTRAVENOUS | Status: AC
  Filled 2020-01-10: qty 1000
  Filled 2020-01-10: qty 2000

## 2020-01-10 MED ORDER — INSULIN ASPART 100 UNIT/ML ~~LOC~~ SOLN
0.0000 [IU] | Freq: Every day | SUBCUTANEOUS | Status: DC
  Administered 2020-01-11 – 2020-01-15 (×2): 2 [IU] via SUBCUTANEOUS

## 2020-01-10 MED ORDER — INSULIN ASPART 100 UNIT/ML ~~LOC~~ SOLN
0.0000 [IU] | Freq: Three times a day (TID) | SUBCUTANEOUS | Status: DC
  Administered 2020-01-11: 3 [IU] via SUBCUTANEOUS
  Administered 2020-01-11: 8 [IU] via SUBCUTANEOUS
  Administered 2020-01-12 (×2): 5 [IU] via SUBCUTANEOUS
  Administered 2020-01-12 – 2020-01-14 (×6): 3 [IU] via SUBCUTANEOUS
  Administered 2020-01-14: 5 [IU] via SUBCUTANEOUS
  Administered 2020-01-15: 3 [IU] via SUBCUTANEOUS
  Administered 2020-01-15: 8 [IU] via SUBCUTANEOUS
  Administered 2020-01-15 – 2020-01-16 (×3): 3 [IU] via SUBCUTANEOUS

## 2020-01-10 MED ORDER — MELATONIN 3 MG PO TABS
3.0000 mg | ORAL_TABLET | Freq: Once | ORAL | Status: AC
  Administered 2020-01-10: 3 mg via ORAL
  Filled 2020-01-10: qty 1

## 2020-01-10 MED ORDER — MELATONIN 3 MG PO TABS
6.0000 mg | ORAL_TABLET | Freq: Once | ORAL | Status: AC
  Administered 2020-01-11: 6 mg via ORAL
  Filled 2020-01-10: qty 2

## 2020-01-10 NOTE — Progress Notes (Signed)
Patient Demographics:    Jamie Knapp, is a 44 y.o. female, DOB - [birth year], HFW:[id number]  Admit date - 01/23/2020   Admitting Physician Frankey Shown, DO  Outpatient Primary MD for the patient is Gweneth Dimitri, MD  LOS - 1   Chief Complaint  Patient presents with  . covid        Subjective:    Jamie Knapp today has  no emesis,  No chest pain,  --Fevers, diarrhea, cough and dyspnea persist -Patient remains hypoxic  Assessment  & Plan :    Principal Problem:   Pneumonia due to COVID-19 virus Active Problems:   Acute respiratory failure with hypoxia (HCC)   Thrombocytopenia (HCC)   Hypokalemia   SIRS (systemic inflammatory response syndrome) (HCC)   Essential hypertension   Seizures (HCC)   Obesity (BMI 30-39.9)   Borderline type 2 diabetes mellitus   Hyperglycemia   Prolonged QT interval   Diarrhea  Brief Summary:- 44 y.o. female with medical history significant for hypertension and seizures admitted on 01/17/2020 with acute hypoxic respiratory failure secondary to Covid pneumonia -Patient received 5 vaccination first dose on 12/31/2019, tested positive for COVID-19 on 01/03/2020  A/p 1)Acute Hypoxic Respiratory Failure secondary to COVID-19 infection/Pneumonia--- The treatment plan and use of medications  for treatment of COVID-19 infection and possible side effects were discussed with patient/family -Cough, shortness of breath and hypoxia persist -----Patient/Family verbalizes understanding and agrees to treatment protocols  --Patient is positive for COVID-19 infection, chest x-ray with findings of infiltrates/opacities,  patient is tachypneic/hypoxic and requiring continuous supplemental oxygen---patient meets criteria for initiation of Remdesivir AND Steroid therapy per protocol  -Ferritin 569>>819 -D-Dimer 0.90 >> 0.73 CRP 18.9 >> 20.3 WBC 5.0 >>4.5 --Check and trend  inflammatory markers including D-dimer, ferritin and  CRP---also follow CBC and CMP --Supplemental oxygen to keep O2 sats above 93% -Follow serial chest x-rays and ABGs as indicated --- Encourage prone positioning for More than 16 hours/day in increments of 2 to 3 hours at a time if able to tolerate --Attempt to maintain euvolemic state --Zinc and vitamin C as ordered -Albuterol inhaler as needed -Accu-Cheks/fingersticks while on high-dose steroids -PPI while on high-dose steroids -Enhanced dosage of anticoagulant for DVT prophylaxis given hypercoagulable state with COVID-19 infections -Continue IV remdesivir and steroids started on 01/06/2020  2)Elevated LFTs  -AST 64 >> 77 ALT 39 >> 46 T Bili 0.7 >> 0.4 -Monitor LFTs closely with remdesivir use  3)Diarrhea--C. difficile negative, suspect diarrhea secondary to Covid 19  Infection  4)DM2--- anticipate worsening hyperglycemia due to steroids,  -Hold Metformin  --- Use Novolog/Humalog Sliding scale insulin with Accu-Cheks/Fingersticks as ordered   5)History of Seizures--- no recent seizures--- continue Keppra, patient may be at slightly increased risk of seizures due to diarrhea with possible shunt of Keppra  6) hypokalemia--- due to diarrhea/GI losses compounded by HCTZ use--replace and recheck  7) prolonged QT syndrome--- keep potassium close to 4, keep magnesium close to 2 -Avoid QT prolonging agents  8)HTN--stop lisinopril and stop HCTZ due to profuse diarrhea with risk for dehydration, no genital normalities and AKI -  may use IV labetalol when necessary  Every 4 hours for systolic blood pressure over 160 mmhg  9) thrombocytopenia--- no bleeding noted, monitor closely especially  while on Lovenox for DVT prophylaxis  Disposition/Need for in-Hospital Stay- patient unable to be discharged at this time due to --acute hypoxic respiratory failure secondary to Covid pneumonia requiring IV remdesivir, supplemental oxygen and IV  steroids  Status is: Inpatient  Remains inpatient appropriate because:-acute hypoxic respiratory failure secondary to Covid pneumonia requiring IV remdesivir, supplemental oxygen and IV steroids   Disposition: The patient is from: Home              Anticipated d/c is to: Home              Anticipated d/c date is: 3 days              Patient currently is not medically stable to d/c. Barriers: Not Clinically Stable- -acute hypoxic respiratory failure secondary to Covid pneumonia requiring IV remdesivir, supplemental oxygen and IV steroids  Code Status : Full code  Family Communication:   (patient is alert, awake and coherent)    Consults  : na  DVT Prophylaxis  :  Lovenox -   - SCDs *  Lab Results  Component Value Date   PLT 110 (L) 01/10/2020    Inpatient Medications  Scheduled Meds: . ascorbic acid  500 mg Oral Daily  . enoxaparin (LOVENOX) injection  55 mg Subcutaneous Q24H  . [START ON 01/11/2020] insulin aspart  0-15 Units Subcutaneous TID WC  . insulin aspart  0-5 Units Subcutaneous QHS  . levETIRAcetam  250 mg Oral BID  . lidocaine  1 patch Transdermal Q24H  . methylPREDNISolone (SOLU-MEDROL) injection  60 mg Intravenous Q12H  . pantoprazole  40 mg Oral Daily  . zinc sulfate  220 mg Oral Daily  . zonisamide  200 mg Oral BID   Continuous Infusions: . 0.9 % NaCl with KCl 40 mEq / L    . remdesivir 100 mg in NS 100 mL     PRN Meds:.chlorpheniramine-HYDROcodone, guaiFENesin-dextromethorphan, labetalol, prochlorperazine    Anti-infectives (From admission, onward)   Start     Dose/Rate Route Frequency Ordered Stop   01/10/20 1000  remdesivir 100 mg in sodium chloride 0.9 % 100 mL IVPB       "Followed by" Linked Group Details   100 mg 200 mL/hr over 30 Minutes Intravenous Daily 01/19/2020 2105 01/14/20 0959   01/03/2020 2145  remdesivir 100 mg in sodium chloride 0.9 % 100 mL IVPB       "Followed by" Linked Group Details   100 mg 200 mL/hr over 30 Minutes Intravenous   Once 01/26/2020 2105 01/19/2020 2215   01/07/2020 2115  remdesivir 100 mg in sodium chloride 0.9 % 100 mL IVPB       "Followed by" Linked Group Details   100 mg 200 mL/hr over 30 Minutes Intravenous  Once 01/27/2020 2105 12/31/2019 2235        Objective:   Vitals:   01/10/20 1700 01/10/20 1730 01/10/20 1820 01/10/20 1830  BP: 104/75 103/75  119/71  Pulse: (!) 106 (!) 111 (!) 107 (!) 103  Resp: (!) 26 (!) 21 (!) 38 (!) 33  Temp:      TempSrc:      SpO2: (!) 87% 90% 91% 90%  Weight:      Height:        Wt Readings from Last 3 Encounters:  01/10/2020 111.1 kg  02/20/16 107.7 kg  10/22/14 106.6 kg     Intake/Output Summary (Last 24 hours) at 01/10/2020 1903 Last data filed at 01/10/2020 0005 Gross per 24 hour  Intake 1056.12 ml  Output --  Net 1056.12 ml     Physical Exam  Gen:- Awake Alert, ill-appearing HEENT:- [place].AT, No sclera icterus Neck-Supple Neck,No JVD,.  Nose-- [place] 3 L/min Lungs-diminished breath sounds with scattered wheezes  CV- S1, S2 normal, regular  Abd-  +ve B.Sounds, Abd Soft, No tenderness,    Extremity/Skin:- No  edema, pedal pulses present  Psych-affect is appropriate, oriented x3 Neuro-no new focal deficits, no tremors   Data Review:   Micro Results Recent Results (from the past 240 hour(s))  SARS Coronavirus 2 by RT PCR (hospital order, performed in [facility] hospital lab) Nasopharyngeal Nasopharyngeal Swab     Status: Abnormal   Collection Time: 2020/01/20  6:15 PM   Specimen: Nasopharyngeal Swab  Result Value Ref Range Status   SARS Coronavirus 2 POSITIVE (A) NEGATIVE Final    Comment: RESULT CALLED TO, READ BACK BY AND VERIFIED WITH: J SAPPELT AT 1930 ON 01/20/20 BY MOSLEY,J (NOTE) SARS-CoV-2 target nucleic acids are DETECTED  SARS-CoV-2 RNA is generally detectable in upper respiratory specimens  during the acute phase of infection.  Positive results are indicative  of the presence of the identified virus, but do not rule out bacterial  infection or co-infection with other pathogens not detected by the test.  Clinical correlation with patient history and  other diagnostic information is necessary to determine patient infection status.  The expected result is negative.  Fact Sheet for Patients:   BoilerBrush.com.cy   Fact Sheet for Healthcare Providers:   https://pope.com/    This test is not yet approved or cleared by the [place] FDA and  has been authorized for detection and/or diagnosis of SARS-CoV-2 by FDA under an Emergency Use Authorization (EUA).  This EUA will remain in effect (meaning  this test can be used) for the duration of  the COVID-19 declaration under Section 564(b)(1) of the Act, 21 U.S.C. section 360-bbb-3(b)(1), unless the authorization is terminated or revoked sooner.  Performed at [facility], [address], [place], [place] [address]   C Difficile Quick Screen w PCR reflex     Status: None   Collection Time: 01/10/20  2:50 AM   Specimen: STOOL  Result Value Ref Range Status   C Diff antigen NEGATIVE NEGATIVE Corrected    Comment: CORRECTED ON 09/12 AT 1820: PREVIOUSLY REPORTED AS NON REACTIVE   C Diff toxin NEGATIVE NEGATIVE Corrected    Comment: CORRECTED ON 09/12 AT 1820: PREVIOUSLY REPORTED AS NON REACTIVE   C Diff interpretation No C. difficile detected.  Corrected    Comment: Performed at [facility], [address] [place], [place] [address] CORRECTED ON 09/12 AT 1820: PREVIOUSLY REPORTED AS VALID     Radiology Reports DG Chest Port 1 View  Result Date: 01-20-2020 CLINICAL DATA:  44 year old with cough, shortness of breath and hypoxia. COVID-19 positive. EXAM: PORTABLE CHEST 1 VIEW COMPARISON:  None. FINDINGS: Cardiac silhouette normal in size for AP portable technique. Patchy and confluent airspace opacities throughout both lungs. Pulmonary vascularity normal. No pleural effusions. IMPRESSION: Acute multilobar pneumonia  involving both lungs. Electronically Signed   By: Hulan Saas M.D.   On: 01/20/20 18:55     CBC Recent Labs  Lab 01/20/20 1820 01/10/20 0613  WBC 5.0 4.5  HGB 15.5* 15.5*  HCT 45.6 46.3*  PLT 110* 110*  MCV 86.5 87.7  MCH 29.4 29.4  MCHC 34.0 33.5  RDW 12.9 13.0  LYMPHSABS 0.6* 0.4*  MONOABS 0.1 0.0*  EOSABS 0.0 0.0  BASOSABS 0.0 0.0    Chemistries  Recent Labs  Lab 01/17/2020 1820 12/30/2019 1821 01/10/2020 1830 01/10/20 0613  NA 133*  --   --  131*  K 3.0*  --   --  3.6  CL 97*  --   --  100  CO2 22  --   --  19*  GLUCOSE 132*  --   --  229*  BUN 11  --   --  12  CREATININE 0.74  --   --  0.76  CALCIUM 8.5*  --   --  7.9*  MG  --  1.9 2.0 2.2  AST 64*  --   --  77*  ALT 39  --   --  46*  ALKPHOS 47  --   --  45  BILITOT 0.7  --   --  0.4   ------------------------------------------------------------------------------------------------------------------ Recent Labs    01/08/2020 1820  TRIG 67    Lab Results  Component Value Date   HGBA1C 6.2 (H) 01/01/2020   ------------------------------------------------------------------------------------------------------------------ No results for input(s): TSH, T4TOTAL, T3FREE, THYROIDAB in the last 72 hours.  Invalid input(s): FREET3 ------------------------------------------------------------------------------------------------------------------ Recent Labs    01/16/2020 1820 01/10/20 0613  FERRITIN 569* 819*    Coagulation profile Recent Labs  Lab 01/10/20 0613  INR 1.0    Recent Labs    01/27/2020 1820 01/10/20 0613  DDIMER 0.90* 0.73*    Cardiac Enzymes No results for input(s): CKMB, TROPONINI, MYOGLOBIN in the last 168 hours.  Invalid input(s): CK ------------------------------------------------------------------------------------------------------------------ No results found for: BNP   Shon Hale M.D on 01/10/2020 at 7:03 PM  Go to [email or web address] - for contact info  [facility] - Office  [phone]

## 2020-01-11 ENCOUNTER — Other Ambulatory Visit: Payer: Self-pay

## 2020-01-11 ENCOUNTER — Encounter (HOSPITAL_COMMUNITY): Payer: Self-pay | Admitting: Internal Medicine

## 2020-01-11 LAB — GLUCOSE, CAPILLARY
Glucose-Capillary: 266 mg/dL — ABNORMAL HIGH (ref 70–99)
Glucose-Capillary: 180 mg/dL — ABNORMAL HIGH (ref 70–99)
Glucose-Capillary: 204 mg/dL — ABNORMAL HIGH (ref 70–99)

## 2020-01-11 LAB — CBC WITH DIFFERENTIAL/PLATELET
Abs Immature Granulocytes: 0.03 10*3/uL (ref 0.00–0.07)
Basophils Absolute: 0 10*3/uL (ref 0.0–0.1)
Basophils Relative: 0 %
Eosinophils Absolute: 0 10*3/uL (ref 0.0–0.5)
Eosinophils Relative: 0 %
HCT: 45.6 % (ref 36.0–46.0)
Hemoglobin: 14.8 g/dL (ref 12.0–15.0)
Immature Granulocytes: 0 %
Lymphocytes Relative: 13 %
Lymphs Abs: 1 10*3/uL (ref 0.7–4.0)
MCH: 28.9 pg (ref 26.0–34.0)
MCHC: 32.5 g/dL (ref 30.0–36.0)
MCV: 89.1 fL (ref 80.0–100.0)
Monocytes Absolute: 0.1 10*3/uL (ref 0.1–1.0)
Monocytes Relative: 2 %
Neutro Abs: 6.4 10*3/uL (ref 1.7–7.7)
Neutrophils Relative %: 85 %
Platelets: 155 10*3/uL (ref 150–400)
RBC: 5.12 MIL/uL — ABNORMAL HIGH (ref 3.87–5.11)
RDW: 13.2 % (ref 11.5–15.5)
WBC: 7.6 10*3/uL (ref 4.0–10.5)
nRBC: 0 % (ref 0.0–0.2)

## 2020-01-11 LAB — COMPREHENSIVE METABOLIC PANEL
ALT: 43 U/L (ref 0–44)
AST: 77 U/L — ABNORMAL HIGH (ref 15–41)
Albumin: 3.1 g/dL — ABNORMAL LOW (ref 3.5–5.0)
Alkaline Phosphatase: 39 U/L (ref 38–126)
Anion gap: 15 (ref 5–15)
BUN: 15 mg/dL (ref 6–20)
CO2: 18 mmol/L — ABNORMAL LOW (ref 22–32)
Calcium: 7.9 mg/dL — ABNORMAL LOW (ref 8.9–10.3)
Chloride: 100 mmol/L (ref 98–111)
Creatinine, Ser: 0.82 mg/dL (ref 0.44–1.00)
GFR calc Af Amer: >60 mL/min (ref >60)
GFR calc non Af Amer: >60 mL/min (ref >60)
Glucose, Bld: 241 mg/dL — ABNORMAL HIGH (ref 70–99)
Potassium: 4.3 mmol/L (ref 3.5–5.1)
Sodium: 133 mmol/L — ABNORMAL LOW (ref 135–145)
Total Bilirubin: 0.5 mg/dL (ref 0.3–1.2)
Total Protein: 6.5 g/dL (ref 6.5–8.1)

## 2020-01-11 LAB — MAGNESIUM: Magnesium: 2.3 mg/dL (ref 1.7–2.4)

## 2020-01-11 LAB — D-DIMER, QUANTITATIVE: D-Dimer, Quant: 0.75 ug/mL-FEU — ABNORMAL HIGH (ref 0.00–0.50)

## 2020-01-11 LAB — C-REACTIVE PROTEIN: CRP: 10.6 mg/dL — ABNORMAL HIGH (ref <1.0)

## 2020-01-11 LAB — FERRITIN: Ferritin: 1288 ng/mL — ABNORMAL HIGH (ref 11–307)

## 2020-01-11 MED ORDER — SODIUM CHLORIDE 0.9 % IV SOLN
INTRAVENOUS | Status: DC | PRN
  Administered 2020-01-11: 500 mL via INTRAVENOUS
  Administered 2020-01-25: 250 mL via INTRAVENOUS

## 2020-01-11 MED ORDER — CHOLESTYRAMINE LIGHT 4 G PO PACK
4.0000 g | PACK | Freq: Two times a day (BID) | ORAL | Status: AC
  Administered 2020-01-11 – 2020-01-13 (×4): 4 g via ORAL
  Filled 2020-01-11 (×4): qty 1

## 2020-01-11 MED ORDER — DOXYCYCLINE HYCLATE 100 MG PO TABS
100.0000 mg | ORAL_TABLET | Freq: Two times a day (BID) | ORAL | Status: DC
  Administered 2020-01-11 – 2020-01-15 (×8): 100 mg via ORAL
  Filled 2020-01-11 (×9): qty 1

## 2020-01-11 MED ORDER — SODIUM CHLORIDE 0.9 % IV SOLN
1.0000 g | INTRAVENOUS | Status: DC
  Administered 2020-01-11 – 2020-01-15 (×5): 1 g via INTRAVENOUS
  Filled 2020-01-11 (×5): qty 10

## 2020-01-11 MED ORDER — INSULIN DETEMIR 100 UNIT/ML ~~LOC~~ SOLN
10.0000 [IU] | Freq: Two times a day (BID) | SUBCUTANEOUS | Status: DC
  Administered 2020-01-11 – 2020-01-14 (×7): 10 [IU] via SUBCUTANEOUS
  Filled 2020-01-11 (×9): qty 0.1

## 2020-01-11 NOTE — Progress Notes (Signed)
Pt oxygen saturation on 15L HFNC 75-80%. Encouraged and explained in detail to pt reasons to prone. Pt adamantly refused; stating "It's too uncomfortable." Pt also stated "I don't understand the big deal. I was just fine downstairs."This nurse asked if the pt understood how COVID or pneumonia worked? Pt says no.  Explained to pt physiology of both illness. Pt still refuses. RT notified and non rebreather applied at 15L in addition to 15L HFNC. Pt saturation 89-91%. Educated pt reasoning of why oxygen devices on. Pt compliant and turned to side to help expand lungs. Will continue to monitor.

## 2020-01-11 NOTE — Progress Notes (Signed)
Patient O2 saturation between 78% to 81% on 10 L HFNC. Patient has dyspnea at rest and during activity. Patient previously refused prone position. However patient was reeducated on the benefits of prone position and improved oxygenation. Patient states she cannot remain in prone position because it is uncomfortable and her "Boobs are too big" to lay on her stomach. Patient is currently in prone position, 15 L HFNC with O2 saturation 91%.

## 2020-01-11 NOTE — Progress Notes (Signed)
Patient Demographics:    Jamie Knapp, is a 44 y.o. female, DOB - 16-Jan-1976, KNL:[id number]  Admit date - 01/05/2020   Admitting Physician Frankey Shown, DO  Outpatient Primary MD for the patient is Gweneth Dimitri, MD  LOS - 2   Chief Complaint  Patient presents with  . covid        Subjective:    [name] today has no emesis, denies chest pain  --Fevers, diarrhea, cough and dyspnea persist -Patient remains hypoxic  Assessment  & Plan :    Principal Problem:   Pneumonia due to COVID-19 virus Active Problems:   Acute respiratory failure with hypoxia (HCC)   Thrombocytopenia (HCC)   Hypokalemia   SIRS (systemic inflammatory response syndrome) (HCC)   Essential hypertension   Seizures (HCC)   Obesity (BMI 30-39.9)   Borderline type 2 diabetes mellitus   Hyperglycemia   Prolonged QT interval   Diarrhea  Brief Summary:- 44 y.o. female with medical history significant for hypertension and seizures admitted on 01/26/2020 with acute hypoxic respiratory failure secondary to Covid pneumonia -Patient received 5 vaccination first dose on 12/31/2019, tested positive for COVID-19 on 01/03/2020  A/p 1)Acute Hypoxic Respiratory Failure secondary to COVID-19 infection/Pneumonia--- The treatment plan and use of medications  for treatment of COVID-19 infection and possible side effects were discussed with patient/family -Cough, shortness of breath and hypoxia persist -----Patient/Family verbalizes understanding and agrees to treatment protocols  --Patient is positive for COVID-19 infection, chest x-ray with findings of infiltrates/opacities,  patient is tachypneic/hypoxic and requiring continuous supplemental oxygen---patient meets criteria for initiation of Remdesivir AND Steroid therapy per protocol  -Ferritin 569>>819 -D-Dimer 0.90 >> 0.73 CRP 18.9 >> 20.3 WBC 5.0 >>4.5 --Check and trend  inflammatory markers including D-dimer, ferritin and  CRP---also follow CBC and CMP --Supplemental oxygen to keep O2 sats above 93% -Follow serial chest x-rays and ABGs as indicated --- Encourage prone positioning for more than 16 hours/day in increments of 2 to 3 hours at a time if able to tolerate --Attempt to maintain euvolemic state --Zinc and vitamin C as ordered -Albuterol inhaler as needed -Accu-Cheks/fingersticks while on high-dose steroids -PPI while on high-dose steroids -Enhanced dosage of anticoagulant for DVT prophylaxis given hypercoagulable state with COVID-19 infections -Continue IV remdesivir and steroids started on 01/12/2020 -As needed antitussives  2)Elevated LFTs  -AST 64 >> 77 ALT 39 >> 43 T Bili 0.7 >> 0.5 -Monitor LFTs closely with remdesivir use  3)Diarrhea--C. difficile negative, suspect diarrhea secondary to Covid 19  Infection  4)DM2--- anticipate worsening hyperglycemia due to steroids,  -Hold Metformin  --- Use Novolog/Humalog Sliding scale insulin with Accu-Cheks/Fingersticks as ordered -May need to start her on basal insulin while on the steroids.   5)History of Seizures--- no recent seizures--- continue Keppra, patient may be at slightly increased risk of seizures due to diarrhea with possible shunt of Keppra  6) hypokalemia--- due to diarrhea/GI losses compounded by HCTZ use--replace and recheck  7) prolonged QT syndrome--- keep potassium close to 4, keep magnesium close to 2 -Avoid QT prolonging agents  8)HTN--stop lisinopril and stop HCTZ due to profuse diarrhea with risk for dehydration, no genital normalities and AKI -  may use IV labetalol when necessary  Every 4 hours for systolic blood  pressure over 160 mmhg  9) thrombocytopenia--- no bleeding noted, monitor closely especially while on Lovenox for DVT prophylaxis  Disposition/Need for in-Hospital Stay- patient unable to be discharged at this time due to --acute hypoxic respiratory failure  secondary to Covid pneumonia requiring IV remdesivir, supplemental oxygen and IV steroids  Status is: Inpatient  Remains inpatient appropriate because:-acute hypoxic respiratory failure secondary to Covid pneumonia requiring IV remdesivir, supplemental oxygen and IV steroids   Disposition: The patient is from: Home              Anticipated d/c is to: Home              Anticipated d/c date is: 3 days              Patient currently is not medically stable to d/c. Barriers: Not Clinically Stable- -acute hypoxic respiratory failure secondary to Covid pneumonia requiring IV remdesivir, supplemental oxygen and IV steroids  Code Status : Full code  Family Communication:   (patient is alert, awake and coherent)    Consults  : na  DVT Prophylaxis  :  Lovenox -   - SCDs *  Lab Results  Component Value Date   PLT 110 (L) 01/10/2020    Inpatient Medications  Scheduled Meds: . ascorbic acid  500 mg Oral Daily  . enoxaparin (LOVENOX) injection  55 mg Subcutaneous Q24H  . insulin aspart  0-15 Units Subcutaneous TID WC  . insulin aspart  0-5 Units Subcutaneous QHS  . levETIRAcetam  250 mg Oral BID  . lidocaine  1 patch Transdermal Q24H  . methylPREDNISolone (SOLU-MEDROL) injection  60 mg Intravenous Q12H  . pantoprazole  40 mg Oral Daily  . zinc sulfate  220 mg Oral Daily  . zonisamide  200 mg Oral BID   Continuous Infusions: . remdesivir 100 mg in NS 100 mL     PRN Meds:.chlorpheniramine-HYDROcodone, guaiFENesin-dextromethorphan, labetalol, prochlorperazine    Anti-infectives (From admission, onward)   Start     Dose/Rate Route Frequency Ordered Stop   01/10/20 1000  remdesivir 100 mg in sodium chloride 0.9 % 100 mL IVPB       "Followed by" Linked Group Details   100 mg 200 mL/hr over 30 Minutes Intravenous Daily 2020-04-28 2105 01/14/20 0959   2020-04-28 2145  remdesivir 100 mg in sodium chloride 0.9 % 100 mL IVPB       "Followed by" Linked Group Details   100 mg 200 mL/hr over  30 Minutes Intravenous  Once 2020-04-28 2105 2020-04-28 2215   2020-04-28 2115  remdesivir 100 mg in sodium chloride 0.9 % 100 mL IVPB       "Followed by" Linked Group Details   100 mg 200 mL/hr over 30 Minutes Intravenous  Once 2020-04-28 2105 2020-04-28 2235        Objective:   Vitals:   01/10/20 2105 01/10/20 2106 01/11/20 0113 01/11/20 0800  BP:   126/70 127/74  Pulse: (!) 108 (!) 108 (!) 106 (!) 106  Resp: (!) 31 (!) 24 20 20   Temp:   98.7 F (37.1 C) 98.6 F (37 C)  TempSrc:   Oral Oral  SpO2: 91% 90% 92%   Weight:      Height:        Wt Readings from Last 3 Encounters:  2020-04-28 111.1 kg  02/20/16 107.7 kg  10/22/14 106.6 kg    No intake or output data in the 24 hours ending 01/11/20 0851   Physical Exam  Gen:- Awake  Alert, ill-appearing HEENT:- [place].AT, No sclera icterus Neck-Supple Neck,No JVD,.  Nose--  3 L/min Lungs-diminished breath sounds with scattered rhonchi, wheezes  CV- S1, S2 normal, regular  Abd-  +ve B.Sounds, Abd Soft, No tenderness,    Extremity/Skin:- No  edema, pedal pulses present  Psych-affect is appropriate, oriented x3 Neuro-no new focal deficits, no tremors   Data Review:   Micro Results Recent Results (from the past 240 hour(s))  SARS Coronavirus 2 by RT PCR (hospital order, performed in [facility] hospital lab) Nasopharyngeal Nasopharyngeal Swab     Status: Abnormal   Collection Time: 01/08/2020  6:15 PM   Specimen: Nasopharyngeal Swab  Result Value Ref Range Status   SARS Coronavirus 2 POSITIVE (A) NEGATIVE Final    Comment: RESULT CALLED TO, READ BACK BY AND VERIFIED WITH: J SAPPELT AT 1930 ON 01/27/2020 BY MOSLEY,J (NOTE) SARS-CoV-2 target nucleic acids are DETECTED  SARS-CoV-2 RNA is generally detectable in upper respiratory specimens  during the acute phase of infection.  Positive results are indicative  of the presence of the identified virus, but do not rule out bacterial infection or co-infection with other pathogens  not detected by the test.  Clinical correlation with patient history and  other diagnostic information is necessary to determine patient infection status.  The expected result is negative.  Fact Sheet for Patients:   BoilerBrush.com.cy   Fact Sheet for Healthcare Providers:   https://pope.com/    This test is not yet approved or cleared by the [place] FDA and  has been authorized for detection and/or diagnosis of SARS-CoV-2 by FDA under an Emergency Use Authorization (EUA).  This EUA will remain in effect (meaning  this test can be used) for the duration of  the COVID-19 declaration under Section 564(b)(1) of the Act, 21 U.S.C. section 360-bbb-3(b)(1), unless the authorization is terminated or revoked sooner.  Performed at [facility], [address] [place], [place] [address]   C Difficile Quick Screen w PCR reflex     Status: None   Collection Time: 01/10/20  2:50 AM   Specimen: STOOL  Result Value Ref Range Status   C Diff antigen NEGATIVE NEGATIVE Corrected    Comment: CORRECTED ON 09/12 AT 1820: PREVIOUSLY REPORTED AS NON REACTIVE   C Diff toxin NEGATIVE NEGATIVE Corrected    Comment: CORRECTED ON 09/12 AT 1820: PREVIOUSLY REPORTED AS NON REACTIVE   C Diff interpretation No C. difficile detected.  Corrected    Comment: Performed at [facility], [address], [place], [place] [address] CORRECTED ON 09/12 AT 1820: PREVIOUSLY REPORTED AS VALID     Radiology Reports DG Chest Port 1 View  Result Date: 01/18/2020 CLINICAL DATA:  44 year old with cough, shortness of breath and hypoxia. COVID-19 positive. EXAM: PORTABLE CHEST 1 VIEW COMPARISON:  None. FINDINGS: Cardiac silhouette normal in size for AP portable technique. Patchy and confluent airspace opacities throughout both lungs. Pulmonary vascularity normal. No pleural effusions. IMPRESSION: Acute multilobar pneumonia involving both lungs. Electronically Signed   By:  Hulan Saas M.D.   On: 01/07/2020 18:55     CBC Recent Labs  Lab 01/11/2020 1820 01/10/20 0613  WBC 5.0 4.5  HGB 15.5* 15.5*  HCT 45.6 46.3*  PLT 110* 110*  MCV 86.5 87.7  MCH 29.4 29.4  MCHC 34.0 33.5  RDW 12.9 13.0  LYMPHSABS 0.6* 0.4*  MONOABS 0.1 0.0*  EOSABS 0.0 0.0  BASOSABS 0.0 0.0    Chemistries  Recent Labs  Lab 01/25/2020 1820 01/23/2020 1821 01/18/2020  1830 01/10/20 0613  NA 133*  --   --  131*  K 3.0*  --   --  3.6  CL 97*  --   --  100  CO2 22  --   --  19*  GLUCOSE 132*  --   --  229*  BUN 11  --   --  12  CREATININE 0.74  --   --  0.76  CALCIUM 8.5*  --   --  7.9*  MG  --  1.9 2.0 2.2  AST 64*  --   --  77*  ALT 39  --   --  46*  ALKPHOS 47  --   --  45  BILITOT 0.7  --   --  0.4   ------------------------------------------------------------------------------------------------------------------ Recent Labs    01/19/2020 1820  TRIG 67    Lab Results  Component Value Date   HGBA1C 6.2 (H) 01/21/2020   ------------------------------------------------------------------------------------------------------------------ No results for input(s): TSH, T4TOTAL, T3FREE, THYROIDAB in the last 72 hours.  Invalid input(s): FREET3 ------------------------------------------------------------------------------------------------------------------ Recent Labs    01/27/2020 1820 01/10/20 0613  FERRITIN 569* 819*    Coagulation profile Recent Labs  Lab 01/10/20 0613  INR 1.0    Recent Labs    01/26/2020 1820 01/10/20 0613  DDIMER 0.90* 0.73*    Cardiac Enzymes No results for input(s): CKMB, TROPONINI, MYOGLOBIN in the last 168 hours.  Invalid input(s): CK ------------------------------------------------------------------------------------------------------------------ No results found for: BNP   Vonzella Nipple M.D on 01/11/2020 at 8:51 AM  Go to [email or web address] - for contact info  [facility] - Office  [phone]

## 2020-01-11 NOTE — Progress Notes (Signed)
Patient has refused Remdesevir IVPB medication.

## 2020-01-11 NOTE — Progress Notes (Signed)
Inpatient Diabetes Program Recommendations  AACE/ADA: New Consensus Statement on Inpatient Glycemic Control (2015)  Target Ranges:  Prepandial:   less than 140 mg/dL      Peak postprandial:   less than 180 mg/dL (1-2 hours)      Critically ill patients:  140 - 180 mg/dL   Lab Results  Component Value Date   GLUCAP 266 (H) 01/11/2020   HGBA1C 6.2 (H) 01/20/2020    Review of Glycemic Control Results for NINOSHKA, WAINWRIGHT (MRN [id number]) as of 01/11/2020 14:16  Ref. Range 01/10/2020 21:08 01/11/2020 11:07  Glucose-Capillary Latest Ref Range: 70 - 99 mg/dL 333 (H) 545 (H)   Diabetes history: DM 2 Outpatient Diabetes medications:  Metformin XR 500 mg bid Current orders for Inpatient glycemic control: Novolog moderate tid with meals and HS Solumedrol 60 mg IV q 12 hours Inpatient Diabetes Program Recommendations:   Please consider adding Levemir 10 units bid while patient is on steroids.   Thanks  Beryl Meager, RN, BC-ADM Inpatient Diabetes Coordinator Pager [phone] (8a-5p)

## 2020-01-12 LAB — CBC WITH DIFFERENTIAL/PLATELET
Abs Immature Granulocytes: 0.08 10*3/uL — ABNORMAL HIGH (ref 0.00–0.07)
Basophils Absolute: 0 10*3/uL (ref 0.0–0.1)
Basophils Relative: 0 %
Eosinophils Absolute: 0 10*3/uL (ref 0.0–0.5)
Eosinophils Relative: 0 %
HCT: 47.8 % — ABNORMAL HIGH (ref 36.0–46.0)
Hemoglobin: 15.3 g/dL — ABNORMAL HIGH (ref 12.0–15.0)
Immature Granulocytes: 1 %
Lymphocytes Relative: 8 %
Lymphs Abs: 1 10*3/uL (ref 0.7–4.0)
MCH: 29.2 pg (ref 26.0–34.0)
MCHC: 32 g/dL (ref 30.0–36.0)
MCV: 91.2 fL (ref 80.0–100.0)
Monocytes Absolute: 0.3 10*3/uL (ref 0.1–1.0)
Monocytes Relative: 2 %
Neutro Abs: 10.6 10*3/uL — ABNORMAL HIGH (ref 1.7–7.7)
Neutrophils Relative %: 89 %
Platelets: 167 10*3/uL (ref 150–400)
RBC: 5.24 MIL/uL — ABNORMAL HIGH (ref 3.87–5.11)
RDW: 13.3 % (ref 11.5–15.5)
WBC: 12 10*3/uL — ABNORMAL HIGH (ref 4.0–10.5)
nRBC: 0 % (ref 0.0–0.2)

## 2020-01-12 LAB — COMPREHENSIVE METABOLIC PANEL
ALT: 36 U/L (ref 0–44)
AST: 69 U/L — ABNORMAL HIGH (ref 15–41)
Albumin: 2.8 g/dL — ABNORMAL LOW (ref 3.5–5.0)
Alkaline Phosphatase: 40 U/L (ref 38–126)
Anion gap: 10 (ref 5–15)
BUN: 15 mg/dL (ref 6–20)
CO2: 21 mmol/L — ABNORMAL LOW (ref 22–32)
Calcium: 7.9 mg/dL — ABNORMAL LOW (ref 8.9–10.3)
Chloride: 105 mmol/L (ref 98–111)
Creatinine, Ser: 0.69 mg/dL (ref 0.44–1.00)
GFR calc Af Amer: >60 mL/min (ref >60)
GFR calc non Af Amer: >60 mL/min (ref >60)
Glucose, Bld: 176 mg/dL — ABNORMAL HIGH (ref 70–99)
Potassium: 4.1 mmol/L (ref 3.5–5.1)
Sodium: 136 mmol/L (ref 135–145)
Total Bilirubin: 0.5 mg/dL (ref 0.3–1.2)
Total Protein: 6.1 g/dL — ABNORMAL LOW (ref 6.5–8.1)

## 2020-01-12 LAB — GLUCOSE, CAPILLARY
Glucose-Capillary: 188 mg/dL — ABNORMAL HIGH (ref 70–99)
Glucose-Capillary: 232 mg/dL — ABNORMAL HIGH (ref 70–99)
Glucose-Capillary: 203 mg/dL — ABNORMAL HIGH (ref 70–99)
Glucose-Capillary: 188 mg/dL — ABNORMAL HIGH (ref 70–99)

## 2020-01-12 LAB — C-REACTIVE PROTEIN: CRP: 5.4 mg/dL — ABNORMAL HIGH (ref <1.0)

## 2020-01-12 LAB — FERRITIN: Ferritin: 1442 ng/mL — ABNORMAL HIGH (ref 11–307)

## 2020-01-12 LAB — D-DIMER, QUANTITATIVE: D-Dimer, Quant: 0.51 ug/mL-FEU — ABNORMAL HIGH (ref 0.00–0.50)

## 2020-01-12 MED ORDER — FUROSEMIDE 10 MG/ML IJ SOLN
20.0000 mg | Freq: Two times a day (BID) | INTRAMUSCULAR | Status: AC
  Administered 2020-01-12 – 2020-01-13 (×3): 20 mg via INTRAVENOUS
  Filled 2020-01-12 (×3): qty 2

## 2020-01-12 NOTE — Progress Notes (Addendum)
Encouraged pt to prone for 15 minutes at a time. Pt agrees. O2 saturation 85-92 % on 15L NCHF and NRB.

## 2020-01-12 NOTE — Progress Notes (Signed)
Pt currently sitting up in chair with legs elevated, resting. O2 sat at 92% on 15L NCHF and NRB. Pt denies any SOB or difficulty breathing at this time.

## 2020-01-12 NOTE — Progress Notes (Signed)
Patient Demographics:    Jamie Knapp, is a 44 y.o. female, DOB - 10-Mar-1976, WNI:[id number]  Admit date - January 19, 2020   Admitting Physician Frankey Shown, DO  Outpatient Primary MD for the patient is Gweneth Dimitri, MD  LOS - 3   Chief Complaint  Patient presents with  . covid        Subjective:    [name] today has no emesis, denies chest pain  --Patient remains hypoxic with nonproductive cough.  Remains on 15 L [place] HF and nonrebreather.  She says her diarrhea is improving.  Assessment  & Plan :    Principal Problem:   Pneumonia due to COVID-19 virus Active Problems:   Acute respiratory failure with hypoxia (HCC)   Thrombocytopenia (HCC)   Hypokalemia   SIRS (systemic inflammatory response syndrome) (HCC)   Essential hypertension   Seizures (HCC)   Obesity (BMI 30-39.9)   Borderline type 2 diabetes mellitus   Hyperglycemia   Prolonged QT interval   Diarrhea  Brief Summary:- 44 y.o. female with medical history significant for hypertension and seizures admitted on 19-Jan-2020 with acute hypoxic respiratory failure secondary to Covid pneumonia -Patient received vaccination first dose on 12/31/2019, tested positive for COVID-19 on 01/03/2020  A/p 1)Acute Hypoxic Respiratory Failure secondary to COVID-19 infection/Pneumonia--- The treatment plan and use of medications  for treatment of COVID-19 infection and possible side effects were discussed with patient/family -Cough, shortness of breath and hypoxia persist -----Patient/Family verbalizes understanding and agrees to treatment protocols  --Patient is positive for COVID-19 infection, chest x-ray with findings of infiltrates/opacities,  patient is tachypneic/hypoxic and requiring continuous supplemental oxygen---patient meets criteria for initiation of Remdesivir AND Steroid therapy per protocol  --CRP today 5.4, D-dimer 0.51, ferritin 1442.   Continue to trend inflammatory markers including D-dimer, ferritin and  CRP---also follow CBC and CMP --Supplemental oxygen to keep O2 sats above 93% -Follow serial chest x-rays and ABGs as indicated --- Encourage prone positioning for more than 16 hours/day in increments of 2 to 3 hours at a time if able to tolerate --Attempt to maintain euvolemic state.  Since she is continuing to require high oxygen will try gentle diuresis. --Zinc and vitamin C as ordered -Albuterol inhaler as needed -Accu-Cheks/fingersticks while on high-dose steroids -PPI while on high-dose steroids -Enhanced dosage of anticoagulant for DVT prophylaxis given hypercoagulable state with COVID-19 infections -Continue IV remdesivir and steroids started on 19-Jan-2020 -As needed antitussives  2)Elevated LFTs  -AST 64 >> 77 ALT 39 >> 43 T Bili 0.7 >> 0.5 -Monitor LFTs closely with remdesivir use  3)Diarrhea--C. difficile negative, suspect diarrhea secondary to Covid 19  Infection -She continued to have multiple bowel movements therefore ordered cholestyramine.  Patient says now the diarrhea is improving.  4)DM2--- anticipate worsening hyperglycemia due to steroids,  -Hold Metformin  --- Use Novolog/Humalog Sliding scale insulin with Accu-Cheks/Fingersticks as ordered -May need to start her on basal insulin while on the steroids if her blood glucose is high.   5)History of Seizures--- no recent seizures--- continue Keppra, patient may be at slightly increased risk of seizures due to diarrhea with possible shunt of Keppra  6) hypokalemia--- due to diarrhea/GI losses compounded by HCTZ use--replaced.  Continue to monitor and replace as needed.  7) prolonged QT syndrome--- keep potassium close to 4, keep magnesium close to 2 -Avoid QT prolonging agents  8)HTN--held lisinopril and HCTZ due to profuse diarrhea with risk for dehydration. -Now that diarrhea is improving and she is continuing to require a lot of oxygen  will try gentle diuresis to see if this helps with her oxygenation. -  IV labetalol when necessary  Every 4 hours for systolic blood pressure over 160 mmhg  9) thrombocytopenia--- treated count improved.  No bleeding noted, monitor closely especially while on Lovenox for DVT prophylaxis  Disposition/Need for in-Hospital Stay- patient unable to be discharged at this time due to --acute hypoxic respiratory failure secondary to Covid pneumonia requiring IV remdesivir, supplemental oxygen and IV steroids  Status is: Inpatient  Remains inpatient appropriate because:-acute hypoxic respiratory failure secondary to Covid pneumonia requiring IV remdesivir, supplemental oxygen and IV steroids   Disposition: The patient is from: Home              Anticipated d/c is to: Home              Anticipated d/c date is: 3 days              Patient currently is not medically stable to d/c. Barriers: Not Clinically Stable- -acute hypoxic respiratory failure secondary to Covid pneumonia requiring IV remdesivir, supplemental oxygen and IV steroids Plan of care discussed with the patient.  Code Status : Full code  Family Communication:   (patient is alert, awake and coherent)    Consults  : na  DVT Prophylaxis  :  Lovenox -   - SCDs *  Lab Results  Component Value Date   PLT 167 01/12/2020    Inpatient Medications  Scheduled Meds: . ascorbic acid  500 mg Oral Daily  . cholestyramine light  4 g Oral BID  . doxycycline  100 mg Oral Q12H  . enoxaparin (LOVENOX) injection  55 mg Subcutaneous Q24H  . furosemide  20 mg Intravenous Q12H  . insulin aspart  0-15 Units Subcutaneous TID WC  . insulin aspart  0-5 Units Subcutaneous QHS  . insulin detemir  10 Units Subcutaneous BID  . levETIRAcetam  250 mg Oral BID  . lidocaine  1 patch Transdermal Q24H  . methylPREDNISolone (SOLU-MEDROL) injection  60 mg Intravenous Q12H  . pantoprazole  40 mg Oral Daily  . zinc sulfate  220 mg Oral Daily  . zonisamide   200 mg Oral BID   Continuous Infusions: . sodium chloride Stopped (01/11/20 2129)  . cefTRIAXone (ROCEPHIN)  IV Stopped (01/11/20 1911)  . remdesivir 100 mg in NS 100 mL 100 mg (01/12/20 1000)   PRN Meds:.sodium chloride, chlorpheniramine-HYDROcodone, guaiFENesin-dextromethorphan, labetalol, prochlorperazine    Anti-infectives (From admission, onward)   Start     Dose/Rate Route Frequency Ordered Stop   01/11/20 2200  doxycycline (VIBRA-TABS) tablet 100 mg        100 mg Oral Every 12 hours 01/11/20 1643     01/11/20 1645  cefTRIAXone (ROCEPHIN) 1 g in sodium chloride 0.9 % 100 mL IVPB        1 g 200 mL/hr over 30 Minutes Intravenous Every 24 hours 01/11/20 1643     01/10/20 1000  remdesivir 100 mg in sodium chloride 0.9 % 100 mL IVPB       "Followed by" Linked Group Details   100 mg 200 mL/hr over 30 Minutes Intravenous Daily 01/22/2020 2105 01/14/20 0959   01/20/2020 2145  remdesivir 100 mg in  sodium chloride 0.9 % 100 mL IVPB       "Followed by" Linked Group Details   100 mg 200 mL/hr over 30 Minutes Intravenous  Once 07-Feb-2020 2105 07-Feb-2020 2215   07-Feb-2020 2115  remdesivir 100 mg in sodium chloride 0.9 % 100 mL IVPB       "Followed by" Linked Group Details   100 mg 200 mL/hr over 30 Minutes Intravenous  Once 07-Feb-2020 2105 07-Feb-2020 2235        Objective:   Vitals:   01/12/20 0900 01/12/20 1000 01/12/20 1225 01/12/20 1313  BP:   112/78   Pulse:   (!) 105   Resp:   18   Temp:   98.9 F (37.2 C)   TempSrc:   Oral   SpO2: 90% 93% (!) 88% 92%  Weight:      Height:        Wt Readings from Last 3 Encounters:  07-Feb-2020 111.1 kg  02/20/16 107.7 kg  10/22/14 106.6 kg     Intake/Output Summary (Last 24 hours) at 01/12/2020 1513 Last data filed at 01/12/2020 1500 Gross per 24 hour  Intake 922.67 ml  Output 400 ml  Net 522.67 ml     Physical Exam  Gen:- Awake Alert, ill-appearing HEENT:- [place].AT, No sclera icterus Neck-Supple Neck,No JVD.  Nose-- [place] 3  L/min Lungs-diminished breath sounds, scattered rhonchi, no wheezes  CV- S1, S2 normal, regular  Abd-  +ve B.Sounds, Abd Soft, No tenderness,    Extremity/Skin:- No  edema, pedal pulses present  Psych-affect is appropriate, oriented x3 Neuro-no new focal deficits, no tremors   Data Review:   Micro Results Recent Results (from the past 240 hour(s))  SARS Coronavirus 2 by RT PCR (hospital order, performed [facility] lab) Nasopharyngeal Nasopharyngeal Swab     Status: Abnormal   Collection Time: 07-Feb-2020  6:15 PM   Specimen: Nasopharyngeal Swab  Result Value Ref Range Status   SARS Coronavirus 2 POSITIVE (A) NEGATIVE Final    Comment: RESULT CALLED TO, READ BACK BY AND VERIFIED WITH: J SAPPELT AT 1930 ON July 15, 2019 BY MOSLEY,J (NOTE) SARS-CoV-2 target nucleic acids are DETECTED  SARS-CoV-2 RNA is generally detectable in upper respiratory specimens  during the acute phase of infection.  Positive results are indicative  of the presence of the identified virus, but do not rule out bacterial infection or co-infection with other pathogens not detected by the test.  Clinical correlation with patient history and  other diagnostic information is necessary to determine patient infection status.  The expected result is negative.  Fact Sheet for Patients:   BoilerBrush.com.cyhttps://www.fda.gov/media/136312/download   Fact Sheet for Healthcare Providers:   https://pope.com/https://www.fda.gov/media/136313/download    This test is not yet approved or cleared by the [place] FDA and  has been authorized for detection and/or diagnosis of SARS-CoV-2 by FDA under an Emergency Use Authorization (EUA).  This EUA will remain in effect (meaning  this test can be used) for the duration of  the COVID-19 declaration under Section 564(b)(1) of the Act, 21 U.S.C. section 360-bbb-3(b)(1), unless the authorization is terminated or revoked sooner.  Performed at [facility], [address] [place], [place] [address]    Blood Culture (routine x 2)     Status: None (Preliminary result)   Collection Time: 07-Feb-2020  6:20 PM   Specimen: Right Antecubital; Blood  Result Value Ref Range Status   Specimen Description RIGHT ANTECUBITAL  Final   Special Requests   Final  BOTTLES DRAWN AEROBIC AND ANAEROBIC Blood Culture adequate volume   Culture   Final    NO GROWTH 3 DAYS Performed at [facility], [address], [place], [place] [address]    Report Status PENDING  Incomplete  Blood Culture (routine x 2)     Status: None (Preliminary result)   Collection Time: 01/15/2020  6:30 PM   Specimen: Left Antecubital; Blood  Result Value Ref Range Status   Specimen Description LEFT ANTECUBITAL  Final   Special Requests   Final    BOTTLES DRAWN AEROBIC AND ANAEROBIC Blood Culture adequate volume   Culture   Final    NO GROWTH 3 DAYS Performed at [facility], [address] [place], [place] [address]    Report Status PENDING  Incomplete  C Difficile Quick Screen w PCR reflex     Status: None   Collection Time: 01/10/20  2:50 AM   Specimen: STOOL  Result Value Ref Range Status   C Diff antigen NEGATIVE NEGATIVE Corrected    Comment: CORRECTED ON 09/12 AT 1820: PREVIOUSLY REPORTED AS NON REACTIVE   C Diff toxin NEGATIVE NEGATIVE Corrected    Comment: CORRECTED ON 09/12 AT 1820: PREVIOUSLY REPORTED AS NON REACTIVE   C Diff interpretation No C. difficile detected.  Corrected    Comment: Performed at [facility], [address] [place], [place] [address] CORRECTED ON 09/12 AT 1820: PREVIOUSLY REPORTED AS VALID     Radiology Reports DG Chest Port 1 View  Result Date: 01/12/2020 CLINICAL DATA:  44 year old with cough, shortness of breath and hypoxia. COVID-19 positive. EXAM: PORTABLE CHEST 1 VIEW COMPARISON:  None. FINDINGS: Cardiac silhouette normal in size for AP portable technique. Patchy and confluent airspace opacities throughout both lungs. Pulmonary vascularity normal. No pleural effusions. IMPRESSION:  Acute multilobar pneumonia involving both lungs. Electronically Signed   By: Hulan Saas M.D.   On: 01/05/2020 18:55     CBC Recent Labs  Lab 01/19/2020 1820 01/10/20 0613 01/11/20 1028 01/12/20 0619  WBC 5.0 4.5 7.6 12.0*  HGB 15.5* 15.5* 14.8 15.3*  HCT 45.6 46.3* 45.6 47.8*  PLT 110* 110* 155 167  MCV 86.5 87.7 89.1 91.2  MCH 29.4 29.4 28.9 29.2  MCHC 34.0 33.5 32.5 32.0  RDW 12.9 13.0 13.2 13.3  LYMPHSABS 0.6* 0.4* 1.0 1.0  MONOABS 0.1 0.0* 0.1 0.3  EOSABS 0.0 0.0 0.0 0.0  BASOSABS 0.0 0.0 0.0 0.0    Chemistries  Recent Labs  Lab 01/12/2020 1820 01/26/2020 1821 01/24/2020 1830 01/10/20 0613 01/11/20 1028 01/12/20 0619  NA 133*  --   --  131* 133* 136  K 3.0*  --   --  3.6 4.3 4.1  CL 97*  --   --  100 100 105  CO2 22  --   --  19* 18* 21*  GLUCOSE 132*  --   --  229* 241* 176*  BUN 11  --   --  CREATININE 0.74  --   --  0.76 0.82 0.69  CALCIUM 8.5*  --   --  7.9* 7.9* 7.9*  MG  --  1.9 2.0 2.2 2.3  --   AST 64*  --   --  77* 77* 69*  ALT 39  --   --  46* 43 36  ALKPHOS 47  --   --  45 39 40  BILITOT 0.7  --   --  0.4 0.5 0.5   ------------------------------------------------------------------------------------------------------------------ Recent Labs  02-01-2020 1820  TRIG 67    Lab Results  Component Value Date   HGBA1C 6.2 (H) 02-01-2020   ------------------------------------------------------------------------------------------------------------------ No results for input(s): TSH, T4TOTAL, T3FREE, THYROIDAB in the last 72 hours.  Invalid input(s): FREET3 ------------------------------------------------------------------------------------------------------------------ Recent Labs    01/11/20 1028 01/12/20 0619  FERRITIN 1,288* 1,442*    Coagulation profile Recent Labs  Lab 01/10/20 0613  INR 1.0    Recent Labs    01/11/20 1028 01/12/20 0619  DDIMER 0.75* 0.51*    Cardiac Enzymes No results for input(s): CKMB,  TROPONINI, MYOGLOBIN in the last 168 hours.  Invalid input(s): CK ------------------------------------------------------------------------------------------------------------------ No results found for: BNP   Vonzella Nipple M.D on 01/12/2020 at 3:13 PM  Go to [email or web address] - for contact info  [facility] - Office  [phone]

## 2020-01-12 NOTE — Progress Notes (Signed)
Patient in prone position at this time. O2 sat 88-94% on 15L NCHF and NRB

## 2020-01-12 NOTE — Plan of Care (Signed)

## 2020-01-13 DIAGNOSIS — E661 Drug-induced obesity: Secondary | ICD-10-CM

## 2020-01-13 DIAGNOSIS — Z6838 Body mass index (BMI) 38.0-38.9, adult: Secondary | ICD-10-CM

## 2020-01-13 LAB — COMPREHENSIVE METABOLIC PANEL
ALT: 32 U/L (ref 0–44)
AST: 53 U/L — ABNORMAL HIGH (ref 15–41)
Albumin: 2.6 g/dL — ABNORMAL LOW (ref 3.5–5.0)
Alkaline Phosphatase: 43 U/L (ref 38–126)
Anion gap: 10 (ref 5–15)
BUN: 19 mg/dL (ref 6–20)
CO2: 24 mmol/L (ref 22–32)
Calcium: 7.9 mg/dL — ABNORMAL LOW (ref 8.9–10.3)
Chloride: 103 mmol/L (ref 98–111)
Creatinine, Ser: 0.79 mg/dL (ref 0.44–1.00)
GFR calc Af Amer: >60 mL/min (ref >60)
GFR calc non Af Amer: >60 mL/min (ref >60)
Glucose, Bld: 196 mg/dL — ABNORMAL HIGH (ref 70–99)
Potassium: 3.8 mmol/L (ref 3.5–5.1)
Sodium: 137 mmol/L (ref 135–145)
Total Bilirubin: 0.7 mg/dL (ref 0.3–1.2)
Total Protein: 6.1 g/dL — ABNORMAL LOW (ref 6.5–8.1)

## 2020-01-13 LAB — CBC WITH DIFFERENTIAL/PLATELET
Abs Immature Granulocytes: 0.07 10*3/uL (ref 0.00–0.07)
Basophils Absolute: 0 10*3/uL (ref 0.0–0.1)
Basophils Relative: 0 %
Eosinophils Absolute: 0 10*3/uL (ref 0.0–0.5)
Eosinophils Relative: 0 %
HCT: 43.6 % (ref 36.0–46.0)
Hemoglobin: 13.9 g/dL (ref 12.0–15.0)
Immature Granulocytes: 1 %
Lymphocytes Relative: 8 %
Lymphs Abs: 0.7 10*3/uL (ref 0.7–4.0)
MCH: 28.7 pg (ref 26.0–34.0)
MCHC: 31.9 g/dL (ref 30.0–36.0)
MCV: 89.9 fL (ref 80.0–100.0)
Monocytes Absolute: 0.3 10*3/uL (ref 0.1–1.0)
Monocytes Relative: 3 %
Neutro Abs: 8 10*3/uL — ABNORMAL HIGH (ref 1.7–7.7)
Neutrophils Relative %: 88 %
Platelets: 164 10*3/uL (ref 150–400)
RBC: 4.85 MIL/uL (ref 3.87–5.11)
RDW: 13.2 % (ref 11.5–15.5)
WBC: 9 10*3/uL (ref 4.0–10.5)
nRBC: 0 % (ref 0.0–0.2)

## 2020-01-13 LAB — GLUCOSE, CAPILLARY
Glucose-Capillary: 188 mg/dL — ABNORMAL HIGH (ref 70–99)
Glucose-Capillary: 169 mg/dL — ABNORMAL HIGH (ref 70–99)
Glucose-Capillary: 192 mg/dL — ABNORMAL HIGH (ref 70–99)
Glucose-Capillary: 162 mg/dL — ABNORMAL HIGH (ref 70–99)

## 2020-01-13 LAB — C-REACTIVE PROTEIN: CRP: 7.7 mg/dL — ABNORMAL HIGH (ref <1.0)

## 2020-01-13 LAB — FERRITIN: Ferritin: 1305 ng/mL — ABNORMAL HIGH (ref 11–307)

## 2020-01-13 LAB — D-DIMER, QUANTITATIVE: D-Dimer, Quant: 0.65 ug/mL-FEU — ABNORMAL HIGH (ref 0.00–0.50)

## 2020-01-13 MED ORDER — PHENOL 1.4 % MT LIQD
1.0000 | OROMUCOSAL | Status: DC | PRN
  Administered 2020-01-13: 1 via OROMUCOSAL
  Filled 2020-01-13: qty 177

## 2020-01-13 NOTE — Plan of Care (Signed)

## 2020-01-13 NOTE — Progress Notes (Signed)
Patient Demographics:    Jamie Knapp, is a 44 y.o. female, DOB - 1976-04-14, YQM:[id number]  Admit date - Jan 25, 2020   Admitting Physician Jamie Shown, DO  Outpatient Primary MD for the patient is Jamie Dimitri, MD  LOS - 4   Chief Complaint  Patient presents with  . covid        Subjective:    [name] no nausea, no vomiting, no chest pain.  Patient is afebrile.  Continues to be significantly hypoxic, short winded with minimal activity and having difficulty speaking in full sentences.  High flow nasal cannula supplementation provided (over 15 L currently).   Assessment  & Plan :    Principal Problem:   Pneumonia due to COVID-19 virus Active Problems:   Acute respiratory failure with hypoxia (HCC)   Thrombocytopenia (HCC)   Hypokalemia   SIRS (systemic inflammatory response syndrome) (HCC)   Essential hypertension   Seizures (HCC)   Obesity (BMI 30-39.9)   Borderline type 2 diabetes mellitus   Hyperglycemia   Prolonged QT interval   Diarrhea  Brief Summary:- 44 y.o. female with medical history significant for hypertension and seizures admitted on 01-25-2020 with acute hypoxic respiratory failure secondary to Covid pneumonia -Patient received vaccination first dose on 12/31/2019, tested positive for COVID-19 on 01/03/2020  A/p 1)Acute Hypoxic Respiratory Failure secondary to COVID-19 infection/Pneumonia--- The treatment plan and use of medications  for treatment of COVID-19 infection and possible side effects were discussed with patient/family -Cough, shortness of breath and hypoxia persist -----Patient/Family verbalizes understanding and agrees to treatment protocols  --Patient is positive for COVID-19 infection, chest x-ray with findings of infiltrates/opacities,  patient is tachypneic/hypoxic and requiring continuous supplemental oxygen---patient meets criteria for initiation of  Remdesivir AND Steroid therapy per protocol  -Continue to trend inflammatory markers including D-dimer, ferritin and  CRP---also follow CBC and CMP --Supplemental oxygen to keep O2 sats above 92% -Follow serial chest x-rays and ABGs as indicated. --- Once again patient has been instructed to follow prone positioning for more than 16 hours/day in increments of 2 to 3 hours at a time if able to tolerate. --Attempt to maintain euvolemic state.  Continue gentle diuresis with Lasix. --Continue the use of zinc and vitamin C as ordered -Continue as needed bronchodilators -Continue prophylactic PPI while on high-dose steroids -Enhanced dosage of anticoagulant for DVT prophylaxis given hypercoagulable state with COVID-19 infections. -Continue IV remdesivir (today is day 5/5); continue IV steroids  -Continue as needed antitussives  2)Elevated LFTs  -AST 64 >> 77 ALT 39 >> 43 T Bili 0.7 >> 0.5 -Continue to follow LFTs while receiving remdesivir treatment.  3)Diarrhea--C. difficile negative, suspect diarrhea secondary to Covid 19  Infection -Good response and improvement to the use of cholestyramine -Patient reported no having significant diarrhea at this time.  4)DM2--- anticipate worsening hyperglycemia due to steroids,  -Continue to hold Metformin while inpatient --Continue sliding scale insulin and follow CBGs -Anticipating increase blood sugar levels with the use of his steroids.   5)History of Seizures--- no recent seizures- -continue Keppra.  6) hypokalemia--- due to diarrhea/GI losses compounded by HCTZ use -Continue to follow electrolytes trend and further replete as needed.  7) prolonged QT syndrome--- keep potassium close to 4, keep magnesium close to 2 -  Avoid QT prolonging agents. -Has remained stable and at this time will discontinue telemetry.  8)HTN--held lisinopril and HCTZ due to profuse diarrhea with risk for dehydration and AKI. -continue lasix -Continue IV labetalol  when necessary  Every 4 hours for systolic blood pressure over 160 mmhg -Vital signs stable  9) thrombocytopenia- -platelets count improved.  No bleeding noted, monitor closely especially while on Lovenox for DVT prophylaxis.   Disposition/Need for in-Hospital Stay- patient unable to be discharged at this time due to --acute hypoxic respiratory failure secondary to Covid pneumonia requiring IV remdesivir, significant amount of supplemental oxygen and ongoing IV steroids.  Status is: Inpatient  Disposition: The patient is from: Home              Anticipated d/c is to: Home              Anticipated d/c date is: To be determined; 3-4 days.              Patient currently is not medically stable to d/c. Barriers: Not Clinically Stable- -acute hypoxic respiratory failure secondary to Covid pneumonia requiring IV remdesivir, significant amount of supplemental oxygen and IV steroids Plan of care discussed with the patient; no family at bedside..  Code Status : Full code  Family Communication:   (patient is alert, awake and coherent)    Consults  : na  DVT Prophylaxis  :  Lovenox -    Lab Results  Component Value Date   PLT 164 01/13/2020    Inpatient Medications  Scheduled Meds: . ascorbic acid  500 mg Oral Daily  . doxycycline  100 mg Oral Q12H  . enoxaparin (LOVENOX) injection  55 mg Subcutaneous Q24H  . insulin aspart  0-15 Units Subcutaneous TID WC  . insulin aspart  0-5 Units Subcutaneous QHS  . insulin detemir  10 Units Subcutaneous BID  . levETIRAcetam  250 mg Oral BID  . lidocaine  1 patch Transdermal Q24H  . methylPREDNISolone (SOLU-MEDROL) injection  60 mg Intravenous Q12H  . pantoprazole  40 mg Oral Daily  . zinc sulfate  220 mg Oral Daily  . zonisamide  200 mg Oral BID   Continuous Infusions: . sodium chloride Stopped (01/11/20 2129)  . cefTRIAXone (ROCEPHIN)  IV 1 g (01/13/20 1653)  . remdesivir 100 mg in NS 100 mL 100 mg (01/13/20 1005)   PRN Meds:.sodium  chloride, chlorpheniramine-HYDROcodone, guaiFENesin-dextromethorphan, labetalol, prochlorperazine    Anti-infectives (From admission, onward)   Start     Dose/Rate Route Frequency Ordered Stop   01/11/20 2200  doxycycline (VIBRA-TABS) tablet 100 mg        100 mg Oral Every 12 hours 01/11/20 1643     01/11/20 1645  cefTRIAXone (ROCEPHIN) 1 g in sodium chloride 0.9 % 100 mL IVPB        1 g 200 mL/hr over 30 Minutes Intravenous Every 24 hours 01/11/20 1643     01/10/20 1000  remdesivir 100 mg in sodium chloride 0.9 % 100 mL IVPB       "Followed by" Linked Group Details   100 mg 200 mL/hr over 30 Minutes Intravenous Daily 11-Jan-2020 2105 01/14/20 0959   11-Jan-2020 2145  remdesivir 100 mg in sodium chloride 0.9 % 100 mL IVPB       "Followed by" Linked Group Details   100 mg 200 mL/hr over 30 Minutes Intravenous  Once 11-Jan-2020 2105 11-Jan-2020 2215   11-Jan-2020 2115  remdesivir 100 mg in sodium chloride 0.9 % 100 mL IVPB       "  Followed by" Linked Group Details   100 mg 200 mL/hr over 30 Minutes Intravenous  Once 01/12/2020 2105 01/21/2020 2235        Objective:   Vitals:   01/13/20 0523 01/13/20 1101 01/13/20 1326 01/13/20 1700  BP: 120/74  120/81   Pulse: 96  (!) 101   Resp: 19  20   Temp: 98.1 F (36.7 C)  97.8 F (36.6 C)   TempSrc: Oral  Oral   SpO2: (!) 85% (!) 88% (!) 87% (!) 89%  Weight:      Height:        Wt Readings from Last 3 Encounters:  01/04/2020 111.1 kg  02/20/16 107.7 kg  10/22/14 106.6 kg     Intake/Output Summary (Last 24 hours) at 01/13/2020 1848 Last data filed at 01/13/2020 1800 Gross per 24 hour  Intake 789.81 ml  Output --  Net 789.81 ml     Physical Exam General exam: Alert, awake, oriented x 3; ill-appearing, still short of breath with minimal expression, able to speak in full sentences but requiring significant amount of high flow nasal cannula supplementation.  Currently afebrile.  Easily desaturating and short winded with activity. Respiratory  system: Diffuse rhonchi bilaterally, positive tachypnea.  No using accessory muscles while resting. Cardiovascular system: Sinus tachycardia, no rubs, no gallops, no JVD on exam.   Gastrointestinal system: Abdomen is obese, nondistended, soft and nontender. No organomegaly or masses felt. Normal bowel sounds heard. Central nervous system: Alert and oriented. No focal neurological deficits. Extremities: No cyanosis or clubbing.  No edema on exam. Skin: No rashes, no petechiae. Psychiatry: Judgement and insight appear normal. Mood & affect appropriate.     Data Review:   Micro Results Recent Results (from the past 240 hour(s))  SARS Coronavirus 2 by RT PCR (hospital order, performed in [facility] hospital lab) Nasopharyngeal Nasopharyngeal Swab     Status: Abnormal   Collection Time: 01/16/2020  6:15 PM   Specimen: Nasopharyngeal Swab  Result Value Ref Range Status   SARS Coronavirus 2 POSITIVE (A) NEGATIVE Final    Comment: RESULT CALLED TO, READ BACK BY AND VERIFIED WITH: J SAPPELT AT 1930 ON 01/12/2020 BY MOSLEY,J (NOTE) SARS-CoV-2 target nucleic acids are DETECTED  SARS-CoV-2 RNA is generally detectable in upper respiratory specimens  during the acute phase of infection.  Positive results are indicative  of the presence of the identified virus, but do not rule out bacterial infection or co-infection with other pathogens not detected by the test.  Clinical correlation with patient history and  other diagnostic information is necessary to determine patient infection status.  The expected result is negative.  Fact Sheet for Patients:   BoilerBrush.com.cy   Fact Sheet for Healthcare Providers:   https://pope.com/    This test is not yet approved or cleared by the [place] FDA and  has been authorized for detection and/or diagnosis of SARS-CoV-2 by FDA under an Emergency Use Authorization (EUA).  This EUA will remain in effect  (meaning  this test can be used) for the duration of  the COVID-19 declaration under Section 564(b)(1) of the Act, 21 U.S.C. section 360-bbb-3(b)(1), unless the authorization is terminated or revoked sooner.  Performed at [facility], [address], [place], [place] [address]   Blood Culture (routine x 2)     Status: None (Preliminary result)   Collection Time: 01/08/2020  6:20 PM   Specimen: Right Antecubital; Blood  Result Value Ref Range Status   Specimen Description RIGHT ANTECUBITAL  Final   Special Requests   Final    BOTTLES DRAWN AEROBIC AND ANAEROBIC Blood Culture adequate volume   Culture   Final    NO GROWTH 4 DAYS Performed at [facility], [address], [place], [place] [address]    Report Status PENDING  Incomplete  Blood Culture (routine x 2)     Status: None (Preliminary result)   Collection Time: January 12, 2020  6:30 PM   Specimen: Left Antecubital; Blood  Result Value Ref Range Status   Specimen Description LEFT ANTECUBITAL  Final   Special Requests   Final    BOTTLES DRAWN AEROBIC AND ANAEROBIC Blood Culture adequate volume   Culture   Final    NO GROWTH 4 DAYS Performed at [facility], [address] [place], [place] [address]    Report Status PENDING  Incomplete  C Difficile Quick Screen w PCR reflex     Status: None   Collection Time: 01/10/20  2:50 AM   Specimen: STOOL  Result Value Ref Range Status   C Diff antigen NEGATIVE NEGATIVE Corrected    Comment: CORRECTED ON 09/12 AT 1820: PREVIOUSLY REPORTED AS NON REACTIVE   C Diff toxin NEGATIVE NEGATIVE Corrected    Comment: CORRECTED ON 09/12 AT 1820: PREVIOUSLY REPORTED AS NON REACTIVE   C Diff interpretation No C. difficile detected.  Corrected    Comment: Performed at [facility], [address] [place], [place] [address] CORRECTED ON 09/12 AT 1820: PREVIOUSLY REPORTED AS VALID     Radiology Reports DG Chest Port 1 View  Result Date: 12-Jan-2020 CLINICAL DATA:  44 year old with cough, shortness of  breath and hypoxia. COVID-19 positive. EXAM: PORTABLE CHEST 1 VIEW COMPARISON:  None. FINDINGS: Cardiac silhouette normal in size for AP portable technique. Patchy and confluent airspace opacities throughout both lungs. Pulmonary vascularity normal. No pleural effusions. IMPRESSION: Acute multilobar pneumonia involving both lungs. Electronically Signed   By: Hulan Saas M.D.   On: 01-12-2020 18:55     CBC Recent Labs  Lab 2020/01/12 1820 01/10/20 0613 01/11/20 1028 01/12/20 0619 01/13/20 0633  WBC 5.0 4.5 7.6 12.0* 9.0  HGB 15.5* 15.5* 14.8 15.3* 13.9  HCT 45.6 46.3* 45.6 47.8* 43.6  PLT 110* 110* 155 167 164  MCV 86.5 87.7 89.1 91.2 89.9  MCH 29.4 29.4 28.9 29.2 28.7  MCHC 34.0 33.5 32.5 32.0 31.9  RDW 12.9 13.0 13.2 13.3 13.2  LYMPHSABS 0.6* 0.4* 1.0 1.0 0.7  MONOABS 0.1 0.0* 0.1 0.3 0.3  EOSABS 0.0 0.0 0.0 0.0 0.0  BASOSABS 0.0 0.0 0.0 0.0 0.0    Chemistries  Recent Labs  Lab 12-Jan-2020 1820 12-Jan-2020 1821 01-12-2020 1830 01/10/20 0613 01/11/20 1028 01/12/20 0619 01/13/20 0633  NA 133*  --   --  131* 133* 136 137  K 3.0*  --   --  3.6 4.3 4.1 3.8  CL 97*  --   --  100 100 105 103  CO2 22  --   --  19* 18* 21* 24  GLUCOSE 132*  --   --  229* 241* 176* 196*  BUN 11  --   --  CREATININE 0.74  --   --  0.76 0.82 0.69 0.79  CALCIUM 8.5*  --   --  7.9* 7.9* 7.9* 7.9*  MG  --  1.9 2.0 2.2 2.3  --   --   AST 64*  --   --  77* 77* 69* 53*  ALT 39  --   --  46* 43 36 32  ALKPHOS 47  --   --  45 39 40 43  BILITOT 0.7  --   --  0.4 0.5 0.5 0.7   ------------------------------------------------------------------------------------------------------------------ No results for input(s): CHOL, HDL, LDLCALC, TRIG, CHOLHDL, LDLDIRECT in the last 72 hours.  Lab Results  Component Value Date   HGBA1C 6.2 (H) 01/20/2020   ------------------------------------------------------------------------------------------------------------------ No results for input(s): TSH,  T4TOTAL, T3FREE, THYROIDAB in the last 72 hours.  Invalid input(s): FREET3 ------------------------------------------------------------------------------------------------------------------ Recent Labs    01/12/20 0619 01/13/20 0633  FERRITIN 1,442* 1,305*    Coagulation profile Recent Labs  Lab 01/10/20 0613  INR 1.0    Recent Labs    01/12/20 0619 01/13/20 0633  DDIMER 0.51* 0.65*    Cardiac Enzymes No results for input(s): CKMB, TROPONINI, MYOGLOBIN in the last 168 hours.  Invalid input(s): CK ------------------------------------------------------------------------------------------------------------------ No results found for: BNP   Vassie Loll MD on 01/13/2020 at 6:48 PM  Go to [email or web address] - for contact info  [facility] - Office  [phone]

## 2020-01-14 LAB — GLUCOSE, CAPILLARY
Glucose-Capillary: 185 mg/dL — ABNORMAL HIGH (ref 70–99)
Glucose-Capillary: 189 mg/dL — ABNORMAL HIGH (ref 70–99)
Glucose-Capillary: 201 mg/dL — ABNORMAL HIGH (ref 70–99)
Glucose-Capillary: 186 mg/dL — ABNORMAL HIGH (ref 70–99)

## 2020-01-14 LAB — COMPREHENSIVE METABOLIC PANEL
ALT: 29 U/L (ref 0–44)
AST: 45 U/L — ABNORMAL HIGH (ref 15–41)
Albumin: 2.5 g/dL — ABNORMAL LOW (ref 3.5–5.0)
Alkaline Phosphatase: 49 U/L (ref 38–126)
Anion gap: 10 (ref 5–15)
BUN: 18 mg/dL (ref 6–20)
CO2: 24 mmol/L (ref 22–32)
Calcium: 8 mg/dL — ABNORMAL LOW (ref 8.9–10.3)
Chloride: 102 mmol/L (ref 98–111)
Creatinine, Ser: 0.62 mg/dL (ref 0.44–1.00)
GFR calc Af Amer: >60 mL/min (ref >60)
GFR calc non Af Amer: >60 mL/min (ref >60)
Glucose, Bld: 202 mg/dL — ABNORMAL HIGH (ref 70–99)
Potassium: 3.7 mmol/L (ref 3.5–5.1)
Sodium: 136 mmol/L (ref 135–145)
Total Bilirubin: 0.8 mg/dL (ref 0.3–1.2)
Total Protein: 6.1 g/dL — ABNORMAL LOW (ref 6.5–8.1)

## 2020-01-14 LAB — CBC WITH DIFFERENTIAL/PLATELET
Abs Immature Granulocytes: 0.19 10*3/uL — ABNORMAL HIGH (ref 0.00–0.07)
Basophils Absolute: 0.1 10*3/uL (ref 0.0–0.1)
Basophils Relative: 0 %
Eosinophils Absolute: 0 10*3/uL (ref 0.0–0.5)
Eosinophils Relative: 0 %
HCT: 43.1 % (ref 36.0–46.0)
Hemoglobin: 14 g/dL (ref 12.0–15.0)
Immature Granulocytes: 2 %
Lymphocytes Relative: 8 %
Lymphs Abs: 1 10*3/uL (ref 0.7–4.0)
MCH: 28.7 pg (ref 26.0–34.0)
MCHC: 32.5 g/dL (ref 30.0–36.0)
MCV: 88.5 fL (ref 80.0–100.0)
Monocytes Absolute: 0.3 10*3/uL (ref 0.1–1.0)
Monocytes Relative: 3 %
Neutro Abs: 10.3 10*3/uL — ABNORMAL HIGH (ref 1.7–7.7)
Neutrophils Relative %: 87 %
Platelets: 183 10*3/uL (ref 150–400)
RBC: 4.87 MIL/uL (ref 3.87–5.11)
RDW: 13 % (ref 11.5–15.5)
WBC: 11.9 10*3/uL — ABNORMAL HIGH (ref 4.0–10.5)
nRBC: 0 % (ref 0.0–0.2)

## 2020-01-14 LAB — D-DIMER, QUANTITATIVE: D-Dimer, Quant: 1.25 ug/mL-FEU — ABNORMAL HIGH (ref 0.00–0.50)

## 2020-01-14 LAB — C-REACTIVE PROTEIN: CRP: 5.1 mg/dL — ABNORMAL HIGH (ref <1.0)

## 2020-01-14 LAB — FERRITIN: Ferritin: 1194 ng/mL — ABNORMAL HIGH (ref 11–307)

## 2020-01-14 MED ORDER — METHYLPREDNISOLONE SODIUM SUCC 125 MG IJ SOLR
60.0000 mg | Freq: Three times a day (TID) | INTRAMUSCULAR | Status: DC
  Administered 2020-01-14 – 2020-01-19 (×14): 60 mg via INTRAVENOUS
  Filled 2020-01-14 (×14): qty 2

## 2020-01-14 MED ORDER — BARICITINIB 2 MG PO TABS
4.0000 mg | ORAL_TABLET | Freq: Every day | ORAL | Status: DC
  Administered 2020-01-14 – 2020-01-15 (×2): 4 mg via ORAL
  Filled 2020-01-14 (×2): qty 2

## 2020-01-14 MED ORDER — INSULIN DETEMIR 100 UNIT/ML ~~LOC~~ SOLN
12.0000 [IU] | Freq: Two times a day (BID) | SUBCUTANEOUS | Status: DC
  Administered 2020-01-14 – 2020-01-16 (×4): 12 [IU] via SUBCUTANEOUS
  Filled 2020-01-14 (×8): qty 0.12

## 2020-01-14 NOTE — Plan of Care (Signed)

## 2020-01-14 NOTE — Progress Notes (Signed)
Patient Demographics:    Jamie Knapp, is a 44 y.o. female, DOB - [birth year], DGU:[id number]  Admit date - 2020-01-18   Admitting Physician Frankey Shown, DO  Outpatient Primary MD for the patient is Gweneth Dimitri, MD  LOS - 5   Chief Complaint  Patient presents with  . covid        Subjective:    Jamie Knapp no nausea, no vomiting, no chest pain.  Patient afebrile currently.  Continue expressing feeling weak, tired with ongoing general malaise.  Significant shortness of breath with activity, requiring over 50 L high flow nasal cannula supplementation and when not at rest desaturating intermittently.   Assessment  & Plan :    Principal Problem:   Pneumonia due to COVID-19 virus Active Problems:   Acute respiratory failure with hypoxia (HCC)   Thrombocytopenia (HCC)   Hypokalemia   SIRS (systemic inflammatory response syndrome) (HCC)   Essential hypertension   Seizures (HCC)   Obesity (BMI 30-39.9)   Borderline type 2 diabetes mellitus   Hyperglycemia   Prolonged QT interval   Diarrhea  Brief Summary:- 44 y.o. female with medical history significant for hypertension and seizures admitted on Jan 18, 2020 with acute hypoxic respiratory failure secondary to Covid pneumonia -Patient received vaccination first dose on 12/31/2019, tested positive for COVID-19 on 01/03/2020  A/p 1)Acute Hypoxic Respiratory Failure secondary to COVID-19 infection/Pneumonia--- The treatment plan and use of medications  for treatment of COVID-19 infection and possible side effects were discussed with patient/family -Cough, shortness of breath and hypoxia persist -----Patient/Family verbalizes understanding and agrees to treatment protocols  --Patient is positive for COVID-19 infection, chest x-ray with findings of infiltrates/opacities,  patient is tachypneic/hypoxic and requiring continuous supplemental  oxygen---patient meets criteria for initiation of Remdesivir AND Steroid therapy per protocol  -Continue to trend inflammatory markers including D-dimer, ferritin and  CRP---also follow CBC and CMP --Supplemental oxygen to keep O2 sats above 92% -Follow serial chest x-rays and ABGs as indicated. --- Once again patient has been instructed to follow prone positioning for more than 16 hours/day in increments of 2 to 3 hours at a time if able to tolerate. --Attempt to maintain euvolemic state.  Continue gentle diuresis with Lasix. --Continue the use of zinc and vitamin C as ordered -Continue as needed bronchodilators -Continue prophylactic PPI while on high-dose steroids -Enhanced dosage of anticoagulant for DVT prophylaxis given hypercoagulable state with COVID-19 infections. -due to ongoing desaturation and higher oxygen requirement, will Continue adjusted dose of steroids, start barcitinib and continue mucolytics -Continue as needed antitussives  2)Elevated LFTs  -AST 64 >> 77 ALT 39 >> 43 T Bili 0.7 >> 0.5 -Continue to follow LFTs trend -remdesivir therapy has been completed.  3)Diarrhea--C. difficile negative, suspect diarrhea secondary to Covid 19  Infection -Good response and improvement to the use of cholestyramine -Patient reported no having significant diarrhea at this time.  4)DM2--- anticipate worsening hyperglycemia due to steroids,  -Continue to hold Metformin while inpatient --Continue sliding scale insulin and lantus -follow CBGs; Anticipating increase blood sugar levels with the use of his steroids. -A1C 6.2   5)History of Seizures--- no recent seizures- -continue Keppra.  6) hypokalemia--- due to diarrhea/GI losses compounded by HCTZ use -Continue to follow electrolytes trend and further replete  as needed.  7) prolonged QT syndrome--- keep potassium close to 4, keep magnesium close to 2 -Avoid QT prolonging agents. -Has remained stable and at this time will  discontinue telemetry.  8)HTN--held lisinopril and HCTZ due to profuse diarrhea with risk for dehydration and AKI. -continue lasix -Continue IV labetalol when necessary  Every 4 hours for systolic blood pressure over 160 mmhg -Vital signs stable  9) thrombocytopenia- -platelets count improved.  No bleeding noted, monitor closely especially while on Lovenox for DVT prophylaxis.   Disposition/Need for in-Hospital Stay- patient unable to be discharged at this time due to --acute hypoxic respiratory failure secondary to Covid pneumonia requiring IV steroids, significant amount of supplemental oxygen and starting barcitinib   Status is: Inpatient  Disposition: The patient is from: Home              Anticipated d/c is to: Home              Anticipated d/c date is: To be determined; 3-4 days.              Patient currently is not medically stable to d/c. Barriers: Not Clinically Stable- -acute hypoxic respiratory failure secondary to Covid pneumonia requiring IV steroids, starting barcitinib and needing significant amount of supplemental oxygen and IV steroids Plan of care discussed with the patient; no family at bedside..  Code Status : Full code  Family Communication:   (patient is alert, awake and coherent)    Consults  : na  DVT Prophylaxis  :  Lovenox -    Lab Results  Component Value Date   PLT 183 01/14/2020    Inpatient Medications  Scheduled Meds: . ascorbic acid  500 mg Oral Daily  . baricitinib  4 mg Oral Daily  . doxycycline  100 mg Oral Q12H  . enoxaparin (LOVENOX) injection  55 mg Subcutaneous Q24H  . insulin aspart  0-15 Units Subcutaneous TID WC  . insulin aspart  0-5 Units Subcutaneous QHS  . insulin detemir  12 Units Subcutaneous BID  . levETIRAcetam  250 mg Oral BID  . lidocaine  1 patch Transdermal Q24H  . methylPREDNISolone (SOLU-MEDROL) injection  60 mg Intravenous Q8H  . pantoprazole  40 mg Oral Daily  . zinc sulfate  220 mg Oral Daily  . zonisamide   200 mg Oral BID   Continuous Infusions: . sodium chloride Stopped (01/11/20 2129)  . cefTRIAXone (ROCEPHIN)  IV Stopped (01/13/20 1723)   PRN Meds:.sodium chloride, chlorpheniramine-HYDROcodone, guaiFENesin-dextromethorphan, labetalol, phenol, prochlorperazine    Anti-infectives (From admission, onward)   Start     Dose/Rate Route Frequency Ordered Stop   01/11/20 2200  doxycycline (VIBRA-TABS) tablet 100 mg        100 mg Oral Every 12 hours 01/11/20 1643 01/16/20 1238   01/11/20 1645  cefTRIAXone (ROCEPHIN) 1 g in sodium chloride 0.9 % 100 mL IVPB        1 g 200 mL/hr over 30 Minutes Intravenous Every 24 hours 01/11/20 1643     01/10/20 1000  remdesivir 100 mg in sodium chloride 0.9 % 100 mL IVPB       "Followed by" Linked Group Details   100 mg 200 mL/hr over 30 Minutes Intravenous Daily January 20, 2020 2105 01/14/20 0959   2020-01-20 2145  remdesivir 100 mg in sodium chloride 0.9 % 100 mL IVPB       "Followed by" Linked Group Details   100 mg 200 mL/hr over 30 Minutes Intravenous  Once 01-20-2020 2105 2020/01/20 2215  01/21/2020 2115  remdesivir 100 mg in sodium chloride 0.9 % 100 mL IVPB       "Followed by" Linked Group Details   100 mg 200 mL/hr over 30 Minutes Intravenous  Once 01/16/2020 2105 12/31/2019 2235        Objective:   Vitals:   01/13/20 1326 01/13/20 1700 01/13/20 2054 01/14/20 0349  BP: 120/81  100/67 109/70  Pulse: (!) 101  100 90  Resp: 20  19 18   Temp: 97.8 F (36.6 C)  98 F (36.7 C) 98.9 F (37.2 C)  TempSrc: Oral  Oral   SpO2: (!) 87% (!) 89% (!) 82% (!) 87%  Weight:      Height:        Wt Readings from Last 3 Encounters:  01/01/2020 111.1 kg  02/20/16 107.7 kg  10/22/14 106.6 kg     Intake/Output Summary (Last 24 hours) at 01/14/2020 1320 Last data filed at 01/14/2020 0900 Gross per 24 hour  Intake 580 ml  Output --  Net 580 ml     Physical Exam General exam: Alert, awake, oriented x 3; still short winded with minimal activity, having difficulty  speaking in full sentences, requiring significant amount of high flow nasal cannula supplementation (over 15 L).  Respiratory system: Poor air movement bilaterally, positive rhonchi diffusely; no wheezing.  Positive tachypnea with minimal exertion.  Using accessory muscles at rest. Cardiovascular system: RRR. No murmurs, rubs, gallops. Gastrointestinal system: Abdomen is obese, nondistended, soft and nontender. No organomegaly or masses felt. Normal bowel sounds heard. Central nervous system: Alert and oriented. No focal neurological deficits. Extremities: No cyanosis or clubbing. Skin: No rashes, no petechiae. Psychiatry: Judgement and insight appear normal. Mood & affect appropriate.     Data Review:   Micro Results Recent Results (from the past 240 hour(s))  SARS Coronavirus 2 by RT PCR (hospital order, performed in [facility] hospital lab) Nasopharyngeal Nasopharyngeal Swab     Status: Abnormal   Collection Time: 01/10/2020  6:15 PM   Specimen: Nasopharyngeal Swab  Result Value Ref Range Status   SARS Coronavirus 2 POSITIVE (A) NEGATIVE Final    Comment: RESULT CALLED TO, READ BACK BY AND VERIFIED WITH: J SAPPELT AT 1930 ON 01/27/2020 BY MOSLEY,J (NOTE) SARS-CoV-2 target nucleic acids are DETECTED  SARS-CoV-2 RNA is generally detectable in upper respiratory specimens  during the acute phase of infection.  Positive results are indicative  of the presence of the identified virus, but do not rule out bacterial infection or co-infection with other pathogens not detected by the test.  Clinical correlation with patient history and  other diagnostic information is necessary to determine patient infection status.  The expected result is negative.  Fact Sheet for Patients:   03/10/2020   Fact Sheet for Healthcare Providers:   BoilerBrush.com.cy    This test is not yet approved or cleared by the https://pope.com/ FDA and  has been  authorized for detection and/or diagnosis of SARS-CoV-2 by FDA under an Emergency Use Authorization (EUA).  This EUA will remain in effect (meaning  this test can be used) for the duration of  the COVID-19 declaration under Section 564(b)(1) of the Act, 21 U.S.C. section 360-bbb-3(b)(1), unless the authorization is terminated or revoked sooner.  Performed at [facility], [address] [place], [place]   Blood Culture (routine x 2)     Status: None (Preliminary result)   Collection Time: 01/08/2020  6:20 PM   Specimen: Right Antecubital; Blood  Result  Value Ref Range Status   Specimen Description RIGHT ANTECUBITAL  Final   Special Requests   Final    BOTTLES DRAWN AEROBIC AND ANAEROBIC Blood Culture adequate volume   Culture   Final    NO GROWTH 4 DAYS Performed at [facility], [address] [place], [place] [address]    Report Status PENDING  Incomplete  Blood Culture (routine x 2)     Status: None (Preliminary result)   Collection Time: 01/07/2020  6:30 PM   Specimen: Left Antecubital; Blood  Result Value Ref Range Status   Specimen Description LEFT ANTECUBITAL  Final   Special Requests   Final    BOTTLES DRAWN AEROBIC AND ANAEROBIC Blood Culture adequate volume   Culture   Final    NO GROWTH 4 DAYS Performed at [facility], [address], [place], [place] [address]    Report Status PENDING  Incomplete  C Difficile Quick Screen w PCR reflex     Status: None   Collection Time: 01/10/20  2:50 AM   Specimen: STOOL  Result Value Ref Range Status   C Diff antigen NEGATIVE NEGATIVE Corrected    Comment: CORRECTED ON 09/12 AT 1820: PREVIOUSLY REPORTED AS NON REACTIVE   C Diff toxin NEGATIVE NEGATIVE Corrected    Comment: CORRECTED ON 09/12 AT 1820: PREVIOUSLY REPORTED AS NON REACTIVE   C Diff interpretation No C. difficile detected.  Corrected    Comment: Performed at [facility], [address], [place], [place] [address] CORRECTED ON 09/12 AT 1820: PREVIOUSLY  REPORTED AS VALID     Radiology Reports DG Chest Port 1 View  Result Date: 01/23/2020 CLINICAL DATA:  44 year old with cough, shortness of breath and hypoxia. COVID-19 positive. EXAM: PORTABLE CHEST 1 VIEW COMPARISON:  None. FINDINGS: Cardiac silhouette normal in size for AP portable technique. Patchy and confluent airspace opacities throughout both lungs. Pulmonary vascularity normal. No pleural effusions. IMPRESSION: Acute multilobar pneumonia involving both lungs. Electronically Signed   By: Hulan Saas M.D.   On: 01/01/2020 18:55     CBC Recent Labs  Lab 01/10/20 9147 01/11/20 1028 01/12/20 0619 01/13/20 0633 01/14/20 0802  WBC 4.5 7.6 12.0* 9.0 11.9*  HGB 15.5* 14.8 15.3* 13.9 14.0  HCT 46.3* 45.6 47.8* 43.6 43.1  PLT 110* 155 167 164 183  MCV 87.7 89.1 91.2 89.9 88.5  MCH 29.4 28.9 29.2 28.7 28.7  MCHC 33.5 32.5 32.0 31.9 32.5  RDW 13.0 13.2 13.3 13.2 13.0  LYMPHSABS 0.4* 1.0 1.0 0.7 1.0  MONOABS 0.0* 0.1 0.3 0.3 0.3  EOSABS 0.0 0.0 0.0 0.0 0.0  BASOSABS 0.0 0.0 0.0 0.0 0.1    Chemistries  Recent Labs  Lab 01/06/2020 1820 12/31/2019 1821 01/26/2020 1830 01/10/20 0613 01/11/20 1028 01/12/20 0619 01/13/20 0633 01/14/20 0802  NA   < >  --   --  131* 133* 136 137 136  K   < >  --   --  3.6 4.3 4.1 3.8 3.7  CL   < >  --   --  100 100 105 103 102  CO2   < >  --   --  19* 18* 21* 24 24  GLUCOSE   < >  --   --  229* 241* 176* 196* 202*  BUN   < >  --   --  CREATININE   < >  --   --  0.76 0.82 0.69 0.79 0.62  CALCIUM   < >  --   --  7.9* 7.9* 7.9* 7.9* 8.0*  MG  --  1.9 2.0 2.2 2.3  --   --   --   AST   < >  --   --  77* 77* 69* 53* 45*  ALT   < >  --   --  46* 43 36 32 29  ALKPHOS   < >  --   --  45 39 40 43 49  BILITOT   < >  --   --  0.4 0.5 0.5 0.7 0.8   < > = values in this interval not displayed.   ------------------------------------------------------------------------------------------------------------------ No results for input(s):  CHOL, HDL, LDLCALC, TRIG, CHOLHDL, LDLDIRECT in the last 72 hours.  Lab Results  Component Value Date   HGBA1C 6.2 (H) 01/23/2020   ----------------------------------------------------------------------------------------------------------------- Recent Labs    01/13/20 0633 01/14/20 0802  FERRITIN 1,305* 1,194*    Coagulation profile Recent Labs  Lab 01/10/20 0613  INR 1.0    Recent Labs    01/13/20 0633 01/14/20 0802  DDIMER 0.65* 1.25*     ------------------------------------------------------------------------------------------------------------------ No results found for: BNP   Vassie Lollarlos Lashone Stauber MD on 01/14/2020 at 1:20 PM  Go to [email or web address] - for contact info  [facility] - Office  [phone]

## 2020-01-15 ENCOUNTER — Inpatient Hospital Stay (HOSPITAL_COMMUNITY): Payer: BC Managed Care – PPO

## 2020-01-15 DIAGNOSIS — Z978 Presence of other specified devices: Secondary | ICD-10-CM

## 2020-01-15 LAB — CULTURE, BLOOD (ROUTINE X 2)
Culture: NO GROWTH
Culture: NO GROWTH
Special Requests: ADEQUATE
Special Requests: ADEQUATE

## 2020-01-15 LAB — GLUCOSE, CAPILLARY
Glucose-Capillary: 182 mg/dL — ABNORMAL HIGH (ref 70–99)
Glucose-Capillary: 197 mg/dL — ABNORMAL HIGH (ref 70–99)
Glucose-Capillary: 285 mg/dL — ABNORMAL HIGH (ref 70–99)
Glucose-Capillary: 248 mg/dL — ABNORMAL HIGH (ref 70–99)

## 2020-01-15 LAB — BLOOD GAS, ARTERIAL
Acid-Base Excess: 0.3 mmol/L (ref 0.0–2.0)
Acid-base deficit: 7.8 mmol/L — ABNORMAL HIGH (ref 0.0–2.0)
Bicarbonate: 17 mmol/L — ABNORMAL LOW (ref 20.0–28.0)
Bicarbonate: 24.7 mmol/L (ref 20.0–28.0)
Drawn by: 35043
FIO2: 100
FIO2: 80
O2 Saturation: 86.6 %
O2 Saturation: 91.2 %
Patient temperature: 37
Patient temperature: 37.3
pCO2 arterial: 35.7 mmHg (ref 32.0–48.0)
pCO2 arterial: 53.5 mmHg — ABNORMAL HIGH (ref 32.0–48.0)
pH, Arterial: 7.181 — CL (ref 7.350–7.450)
pH, Arterial: 7.441 (ref 7.350–7.450)
pO2, Arterial: 53.9 mmHg — ABNORMAL LOW (ref 83.0–108.0)
pO2, Arterial: 91.3 mmHg (ref 83.0–108.0)

## 2020-01-15 LAB — MRSA PCR SCREENING: MRSA by PCR: NEGATIVE

## 2020-01-15 LAB — PATHOLOGIST SMEAR REVIEW

## 2020-01-15 MED ORDER — ZINC SULFATE 220 (50 ZN) MG PO CAPS
220.0000 mg | ORAL_CAPSULE | Freq: Every day | ORAL | Status: DC
  Administered 2020-01-16 – 2020-01-27 (×12): 220 mg
  Filled 2020-01-15 (×12): qty 1

## 2020-01-15 MED ORDER — FENTANYL CITRATE (PF) 100 MCG/2ML IJ SOLN: 50.0000 ug | INTRAMUSCULAR | Status: DC | PRN

## 2020-01-15 MED ORDER — FENTANYL CITRATE (PF) 100 MCG/2ML IJ SOLN
50.0000 ug | INTRAMUSCULAR | Status: DC | PRN
  Administered 2020-01-15 – 2020-01-16 (×9): 100 ug via INTRAVENOUS
  Administered 2020-01-16: 200 ug via INTRAVENOUS
  Filled 2020-01-15 (×6): qty 2
  Filled 2020-01-15: qty 4
  Filled 2020-01-15: qty 2

## 2020-01-15 MED ORDER — METOPROLOL TARTRATE 5 MG/5ML IV SOLN
INTRAVENOUS | Status: AC
  Filled 2020-01-15: qty 5

## 2020-01-15 MED ORDER — ASCORBIC ACID 500 MG PO TABS
500.0000 mg | ORAL_TABLET | Freq: Every day | ORAL | Status: DC
  Administered 2020-01-16 – 2020-01-27 (×12): 500 mg
  Filled 2020-01-15 (×12): qty 1

## 2020-01-15 MED ORDER — DOXYCYCLINE HYCLATE 100 MG PO TABS
100.0000 mg | ORAL_TABLET | Freq: Two times a day (BID) | ORAL | Status: AC
  Administered 2020-01-15 – 2020-01-16 (×2): 100 mg
  Filled 2020-01-15: qty 1

## 2020-01-15 MED ORDER — FENTANYL CITRATE (PF) 100 MCG/2ML IJ SOLN
INTRAMUSCULAR | Status: AC
  Filled 2020-01-15: qty 2

## 2020-01-15 MED ORDER — DOCUSATE SODIUM 50 MG/5ML PO LIQD
100.0000 mg | Freq: Two times a day (BID) | ORAL | Status: DC
  Filled 2020-01-15: qty 10

## 2020-01-15 MED ORDER — CHLORHEXIDINE GLUCONATE CLOTH 2 % EX PADS
6.0000 | MEDICATED_PAD | Freq: Every day | CUTANEOUS | Status: DC
  Administered 2020-01-15 – 2020-01-27 (×13): 6 via TOPICAL

## 2020-01-15 MED ORDER — PROPOFOL 1000 MG/100ML IV EMUL
INTRAVENOUS | Status: AC
  Filled 2020-01-15: qty 100

## 2020-01-15 MED ORDER — PROPOFOL 1000 MG/100ML IV EMUL
0.0000 ug/kg/min | INTRAVENOUS | Status: DC
  Administered 2020-01-15 (×2): 49.955 ug/kg/min via INTRAVENOUS
  Administered 2020-01-15: 40 ug/kg/min via INTRAVENOUS
  Administered 2020-01-16: 49.955 ug/kg/min via INTRAVENOUS
  Administered 2020-01-16 (×3): 50 ug/kg/min via INTRAVENOUS
  Administered 2020-01-16: 49.955 ug/kg/min via INTRAVENOUS
  Administered 2020-01-16 – 2020-01-18 (×12): 50 ug/kg/min via INTRAVENOUS
  Filled 2020-01-15 (×3): qty 100
  Filled 2020-01-15: qty 200
  Filled 2020-01-15 (×8): qty 100
  Filled 2020-01-15: qty 200
  Filled 2020-01-15 (×6): qty 100

## 2020-01-15 MED ORDER — HYDROCOD POLST-CPM POLST ER 10-8 MG/5ML PO SUER: 5.0000 mL | Freq: Two times a day (BID) | ORAL | Status: DC | PRN

## 2020-01-15 MED ORDER — POLYETHYLENE GLYCOL 3350 17 G PO PACK: 17.0000 g | PACK | Freq: Every day | ORAL | Status: DC

## 2020-01-15 MED ORDER — DOCUSATE SODIUM 50 MG/5ML PO LIQD
100.0000 mg | Freq: Two times a day (BID) | ORAL | Status: DC
  Administered 2020-01-15 – 2020-01-19 (×9): 100 mg
  Filled 2020-01-15 (×9): qty 10

## 2020-01-15 MED ORDER — ETOMIDATE 2 MG/ML IV SOLN
30.0000 mg | Freq: Once | INTRAVENOUS | Status: AC
  Administered 2020-01-15: 30 mg via INTRAVENOUS

## 2020-01-15 MED ORDER — ALBUTEROL SULFATE (2.5 MG/3ML) 0.083% IN NEBU
INHALATION_SOLUTION | RESPIRATORY_TRACT | Status: AC
  Administered 2020-01-15: 2.5 mg
  Filled 2020-01-15: qty 6

## 2020-01-15 MED ORDER — LORAZEPAM 2 MG/ML IJ SOLN
INTRAMUSCULAR | Status: AC
  Administered 2020-01-15: 2 mg
  Filled 2020-01-15: qty 1

## 2020-01-15 MED ORDER — SUCCINYLCHOLINE CHLORIDE 20 MG/ML IJ SOLN
110.0000 mg | Freq: Once | INTRAMUSCULAR | Status: AC
  Administered 2020-01-15: 110 mg via INTRAVENOUS

## 2020-01-15 MED ORDER — GUAIFENESIN-DM 100-10 MG/5ML PO SYRP
5.0000 mL | ORAL_SOLUTION | ORAL | Status: DC | PRN
  Filled 2020-01-15: qty 5

## 2020-01-15 MED ORDER — POLYETHYLENE GLYCOL 3350 17 G PO PACK
17.0000 g | PACK | Freq: Every day | ORAL | Status: DC
  Administered 2020-01-17 – 2020-01-18 (×2): 17 g
  Filled 2020-01-15 (×4): qty 1

## 2020-01-15 MED ORDER — DILTIAZEM HCL 25 MG/5ML IV SOLN
INTRAVENOUS | Status: AC
  Filled 2020-01-15: qty 5

## 2020-01-15 NOTE — Progress Notes (Signed)
Patient was 78% on 15L HFNC and NRB upon arrival. Patient has not been willing to prone. Placed patient on Heated HFNC 35L @ 100% and talked with her about next steps in her care if she continues to sat this low. Patient stated she does not want to be intubated but told her that's where we were headed if we could not get her O2 levels up. Patient is proning now, but still had to add the NRB to get O2 levels above 77%. Discussed with RN. Patient currently at 83% and slowly climbing.

## 2020-01-15 NOTE — Progress Notes (Signed)
Have increased VT to 500 cc about 8 cc kg, Did encounter Peak pressures in the 80's most likely mucous plug vs pneumo. Did check x-ray?? Results not known . Bagged and lavaged patient obtained thick bloody plug. Started albuterol aerosol with saline for high pressures. Patient appears improved will draw abg shortly.

## 2020-01-15 NOTE — Progress Notes (Signed)
CRITICAL VALUE ALERT  Critical Value:  PH ABG  Date & Time Notied:  01/15/2020 @ 1930  Provider Notified: Dr. Randol Kern   Orders Received/Actions taken: no new orders

## 2020-01-15 NOTE — ED Provider Notes (Signed)
I responded to a code blue. Patient at that time was not pulseless but was very hypoxic and agitated.  It became clear that she needed to be intubated.  Lost her IV access so there was a delay.  Plan to to give IM Ativan but then was able to give IV which did calm her down.  She was RSI'd.  Initial 7.5 tube was unable to be passed and so this had to be switched out to a 7.0 which did pass.  However due to prolonged hypoxia she did briefly code and after 1 round of CPR was brought back.  After this, patient care transferred to the hospitalist service who is at the bedside.  INTUBATION Performed by: Audree Camel  Required items: required blood products, implants, devices, and special equipment available Patient identity confirmed: provided demographic data and hospital-assigned identification number Time out: Immediately prior to procedure a "time out" was called to verify the correct patient, procedure, equipment, support staff and site/side marked as required.  Indications: respiratory failure  Intubation method: Glidescope Laryngoscopy   Preoxygenation: BVM  Sedatives: Etomidate Paralytic: Succinylcholine  Tube Size: 7.0 cuffed  Post-procedure assessment: chest rise and ETCO2 monitor Breath sounds: equal and absent over the epigastrium Tube secured with: ETT holder Chest x-ray currently pending.   Patient tolerated the procedure well with no immediate complications.      Pricilla Loveless, MD 01/15/20 [phone]

## 2020-01-15 NOTE — Progress Notes (Signed)
Patient Demographics:    Jamie Knapp, is a 44 y.o. female, DOB - 1975-08-22, GHW:[id number]  Admit date - 01/03/2020   Admitting Physician Frankey Shown, DO  Outpatient Primary MD for the patient is Gweneth Dimitri, MD  LOS - 6   Chief Complaint  Patient presents with  . covid        Subjective:    [name] no fever, no nausea, no vomiting. Acute resp failure with hypoxia, required ventilatory support after resp arrest. Will transfer to [facility] for critical care management.   Assessment  & Plan :    Principal Problem:   Pneumonia due to COVID-19 virus Active Problems:   Acute respiratory failure with hypoxia (HCC)   Thrombocytopenia (HCC)   Hypokalemia   SIRS (systemic inflammatory response syndrome) (HCC)   Essential hypertension   Seizures (HCC)   Obesity (BMI 30-39.9)   Borderline type 2 diabetes mellitus   Hyperglycemia   Prolonged QT interval   Diarrhea  Brief Summary:- 44 y.o. female with medical history significant for hypertension and seizures admitted on 01/06/2020 with acute hypoxic respiratory failure secondary to Covid pneumonia -Patient received vaccination first dose on 12/31/2019, tested positive for COVID-19 on 01/03/2020  A/p 1)Acute Hypoxic Respiratory Failure secondary to COVID-19 infection/Pneumonia--- The treatment plan and use of medications  for treatment of COVID-19 infection and possible side effects were discussed with patient/family -Cough, shortness of breath and hypoxia persist -----Patient/Family verbalizes understanding and agrees to treatment protocols  --Patient is positive for COVID-19 infection, chest x-ray with findings of infiltrates/opacities,  patient is tachypneic/hypoxic and requiring continuous supplemental oxygen---patient meets criteria for initiation of Remdesivir AND Steroid therapy per protocol  -Continue to trend inflammatory markers including  D-dimer, ferritin and  CRP---also follow CBC and CMP --Supplemental oxygen to keep O2 sats above 92% -Follow serial chest x-rays and ABGs as indicated. --- Once again patient has been instructed to follow prone positioning for more than 16 hours/day in increments of 2 to 3 hours at a time if able to tolerate. --Attempt to maintain euvolemic state.  Continue gentle diuresis with Lasix. --Continue the use of zinc and vitamin C as ordered -Continue as needed bronchodilators -Continue prophylactic PPI while on high-dose steroids -Enhanced dosage of anticoagulant for DVT prophylaxis given hypercoagulable state with COVID-19 infections. -continue high dose solumedrol and barcitinib (last one day 2/10) -Continue as needed antitussives  2)Elevated LFTs  -AST 64 >> 77 ALT 39 >> 43 T Bili 0.7 >> 0.5 -Continue to follow LFTs trend -remdesivir therapy has been completed.  3)Diarrhea--C. difficile negative, suspect diarrhea secondary to Covid 19  Infection -Good response and improvement to the use of cholestyramine -Patient reported no having significant diarrhea at this time.  4)DM2--- anticipate worsening hyperglycemia due to steroids,  -Continue to hold Metformin while inpatient --Continue sliding scale insulin and lantus -follow CBGs; Anticipating increase blood sugar levels with the use of his steroids. -A1C 6.2   5)History of Seizures--- no recent seizures- -continue Keppra.  6) hypokalemia--- due to diarrhea/GI losses compounded by HCTZ use -Continue to follow electrolytes trend and further replete as needed.  7) prolonged QT syndrome--- keep potassium close to 4, keep magnesium close to 2 -Avoid QT prolonging agents. -Has remained stable and at this time  will discontinue telemetry.  8)HTN--held lisinopril and HCTZ due to profuse diarrhea with risk for dehydration and AKI. -continue lasix as BP allows it -Continue IV labetalol when necessary  Every 4 hours for systolic blood  pressure over 161 mmhg -Vital signs stable  9) thrombocytopenia- -platelets count improved.  No bleeding noted, monitor closely especially while on Lovenox for DVT prophylaxis.  10-acute resp failure with hypoxia -patient with acute decompensation and resp arrest -needed ventilatory support  -will be transfer to [facility] for critical care management and further adjustment and titration of vent settings  11-transient A. Fib/SVT/cardiac arrest -following resp arrest -transient CPR, epinephrine, adenosine and metoprolol required -patient back in sinus rhythm -whole resuscitation event 10-15 minutes.    Disposition/Need for in-Hospital Stay- patient unable to be discharged at this time due to --acute hypoxic respiratory failure secondary to Covid pneumonia requiring IV steroids, intubation/ventilatory support and barcitinib. Patient will be transfer to [facility] under PCCM service.   Status is: Inpatient  Disposition: The patient is from: Home              Anticipated d/c is to: to be determined.               Anticipated d/c date is: To be determined              Patient currently is not medically stable to d/c. Barriers: Not Clinically Stable- -acute hypoxic respiratory failure secondary to Covid pneumonia requiring IV steroids, starting barcitinib and needing now ventilatory support and IV steroids.   Plan of care discussed with patient's mother over the phone.  Code Status : Full code  Family Communication:   (patient is alert, awake and coherent)    Consults  : na  DVT Prophylaxis  :  Lovenox -    Lab Results  Component Value Date   PLT 183 01/14/2020    Inpatient Medications  Scheduled Meds: . ascorbic acid  500 mg Oral Daily  . baricitinib  4 mg Oral Daily  . Chlorhexidine Gluconate Cloth  6 each Topical Daily  . diltiazem      . docusate  100 mg Oral BID  . doxycycline  100 mg Oral Q12H  . enoxaparin (LOVENOX) injection  55 mg Subcutaneous Q24H  . etomidate  30 mg  Intravenous Once  . fentaNYL      . insulin aspart  0-15 Units Subcutaneous TID WC  . insulin aspart  0-5 Units Subcutaneous QHS  . insulin detemir  12 Units Subcutaneous BID  . levETIRAcetam  250 mg Oral BID  . lidocaine  1 patch Transdermal Q24H  . LORazepam      . methylPREDNISolone (SOLU-MEDROL) injection  60 mg Intravenous Q8H  . metoprolol tartrate      . pantoprazole  40 mg Oral Daily  . polyethylene glycol  17 g Oral Daily  . succinylcholine  110 mg Intravenous Once  . zinc sulfate  220 mg Oral Daily  . zonisamide  200 mg Oral BID   Continuous Infusions: . sodium chloride Stopped (01/11/20 2129)  . cefTRIAXone (ROCEPHIN)  IV 1 g (01/14/20 1713)  . propofol    . propofol (DIPRIVAN) infusion     PRN Meds:.sodium chloride, chlorpheniramine-HYDROcodone, fentaNYL (SUBLIMAZE) injection, fentaNYL (SUBLIMAZE) injection, guaiFENesin-dextromethorphan, labetalol, phenol, prochlorperazine    Anti-infectives (From admission, onward)   Start     Dose/Rate Route Frequency Ordered Stop   01/11/20 2200  doxycycline (VIBRA-TABS) tablet 100 mg        100  mg Oral Every 12 hours 01/11/20 1643 01/16/20 1238   01/11/20 1645  cefTRIAXone (ROCEPHIN) 1 g in sodium chloride 0.9 % 100 mL IVPB        1 g 200 mL/hr over 30 Minutes Intravenous Every 24 hours 01/11/20 1643     01/10/20 1000  remdesivir 100 mg in sodium chloride 0.9 % 100 mL IVPB       "Followed by" Linked Group Details   100 mg 200 mL/hr over 30 Minutes Intravenous Daily 01/26/2020 2105 01/14/20 0959   01/22/2020 2145  remdesivir 100 mg in sodium chloride 0.9 % 100 mL IVPB       "Followed by" Linked Group Details   100 mg 200 mL/hr over 30 Minutes Intravenous  Once 01/14/2020 2105 01/27/2020 2215   01/27/2020 2115  remdesivir 100 mg in sodium chloride 0.9 % 100 mL IVPB       "Followed by" Linked Group Details   100 mg 200 mL/hr over 30 Minutes Intravenous  Once 12/31/2019 2105 01/10/2020 2235        Objective:   Vitals:   01/15/20 1400  01/15/20 1500 01/15/20 1600 01/15/20 1718  BP: 140/88 140/82 140/74   Pulse: 96 95 (!) 102   Resp: (!) 26 (!) 35 (!) 39   Temp:      TempSrc:      SpO2: 97% 100% 98% 99%  Weight:      Height:   5\' 7"  (1.702 m)     Wt Readings from Last 3 Encounters:  01/17/2020 111.1 kg  02/20/16 107.7 kg  10/22/14 106.6 kg    No intake or output data in the 24 hours ending 01/15/20 1743   Physical Exam General exam: with acute desaturation overnight requiring to be transfer to the unit. Patient spent the morning requiring FIO2 % of 30% through HFNC, and salter heated non-rebreather. She ended removing her mask in a period of confusion and became extremely hypoxic to the point of experiencing respiratory arrest and requirement of intubation. No fever.  Respiratory system: no wheezing, positive rhonchi bilaterally, fair air movement.  Cardiovascular system: transient SVT/a. Fib; broke with adenosin and metoprolol, back in sinus rhythm currently, no rubs, no gallops, no murmur.  Gastrointestinal system: Abdomen is obese, nondistended, soft and nontender. No organomegaly or masses felt. Normal bowel sounds heard. Central nervous system: unable to assess due to sedation Extremities: No cyanosis, no clubbing, trace edema appreciated on exam.  Skin: No rashes, no petechiae, bruises from lovenox injection appreciated on her abdomen.  Psychiatry: unable to properly assess now after sedation and intubation. But oriented X3 earlier today (10:30 am)     Data Review:   Micro Results Recent Results (from the past 240 hour(s))  SARS Coronavirus 2 by RT PCR (hospital order, performed in [facility] hospital lab) Nasopharyngeal Nasopharyngeal Swab     Status: Abnormal   Collection Time: 01/11/2020  6:15 PM   Specimen: Nasopharyngeal Swab  Result Value Ref Range Status   SARS Coronavirus 2 POSITIVE (A) NEGATIVE Final    Comment: RESULT CALLED TO, READ BACK BY AND VERIFIED WITH: J SAPPELT AT 1930 ON 01/27/2020 BY  MOSLEY,J (NOTE) SARS-CoV-2 target nucleic acids are DETECTED  SARS-CoV-2 RNA is generally detectable in upper respiratory specimens  during the acute phase of infection.  Positive results are indicative  of the presence of the identified virus, but do not rule out bacterial infection or co-infection with other pathogens not detected by the test.  Clinical correlation with  patient history and  other diagnostic information is necessary to determine patient infection status.  The expected result is negative.  Fact Sheet for Patients:   BoilerBrush.com.cy   Fact Sheet for Healthcare Providers:   https://pope.com/    This test is not yet approved or cleared by the [place] FDA and  has been authorized for detection and/or diagnosis of SARS-CoV-2 by FDA under an Emergency Use Authorization (EUA).  This EUA will remain in effect (meaning  this test can be used) for the duration of  the COVID-19 declaration under Section 564(b)(1) of the Act, 21 U.S.C. section 360-bbb-3(b)(1), unless the authorization is terminated or revoked sooner.  Performed at [facility], [address] [place], [place] [address]   Blood Culture (routine x 2)     Status: None   Collection Time: 01/10/2020  6:20 PM   Specimen: Right Antecubital; Blood  Result Value Ref Range Status   Specimen Description RIGHT ANTECUBITAL  Final   Special Requests   Final    BOTTLES DRAWN AEROBIC AND ANAEROBIC Blood Culture adequate volume   Culture   Final    NO GROWTH 6 DAYS Performed at [facility], [address], [place], [place] [address]    Report Status 01/15/2020 FINAL  Final  Blood Culture (routine x 2)     Status: None   Collection Time: 01/24/2020  6:30 PM   Specimen: Left Antecubital; Blood  Result Value Ref Range Status   Specimen Description LEFT ANTECUBITAL  Final   Special Requests   Final    BOTTLES DRAWN AEROBIC AND ANAEROBIC Blood Culture adequate volume    Culture   Final    NO GROWTH 6 DAYS Performed at [facility], [address] [place], [place] [address]    Report Status 01/15/2020 FINAL  Final  C Difficile Quick Screen w PCR reflex     Status: None   Collection Time: 01/10/20  2:50 AM   Specimen: STOOL  Result Value Ref Range Status   C Diff antigen NEGATIVE NEGATIVE Corrected    Comment: CORRECTED ON 09/12 AT 1820: PREVIOUSLY REPORTED AS NON REACTIVE   C Diff toxin NEGATIVE NEGATIVE Corrected    Comment: CORRECTED ON 09/12 AT 1820: PREVIOUSLY REPORTED AS NON REACTIVE   C Diff interpretation No C. difficile detected.  Corrected    Comment: Performed at [facility], [address], [place], [place] [address] CORRECTED ON 09/12 AT 1820: PREVIOUSLY REPORTED AS VALID   MRSA PCR Screening     Status: None   Collection Time: 01/15/20  1:25 AM   Specimen: Nasal Mucosa; Nasopharyngeal  Result Value Ref Range Status   MRSA by PCR NEGATIVE NEGATIVE Final    Comment:        The GeneXpert MRSA Assay (FDA approved for NASAL specimens only), is one component of a comprehensive MRSA colonization surveillance program. It is not intended to diagnose MRSA infection nor to guide or monitor treatment for MRSA infections. Performed at [facility], [address] [place], [place] [address]     Radiology Reports DG CHEST PORT 1 VIEW  Result Date: 01/15/2020 CLINICAL DATA:  Hypoxia, COVID EXAM: PORTABLE CHEST 1 VIEW COMPARISON:  01/11/2020 FINDINGS: Diffuse bilateral airspace disease is worsening since prior study. Cardiomegaly. Possible small bilateral effusions. No pneumothorax. No acute bony abnormality. IMPRESSION: Worsening diffuse bilateral airspace disease. Possible small effusions. Electronically Signed   By: Charlett Nose M.D.   On: 01/15/2020 05:05   DG Chest [facility] [address]  Result Date: 01/04/2020 CLINICAL DATA:  44 year old with cough, shortness of breath and hypoxia. COVID-19 positive. EXAM: PORTABLE CHEST 1 VIEW COMPARISON:  None.  FINDINGS: Cardiac silhouette normal in size for AP portable technique. Patchy and confluent airspace opacities throughout both lungs. Pulmonary vascularity normal. No pleural effusions. IMPRESSION: Acute multilobar pneumonia involving both lungs. Electronically Signed   By: Hulan Saashomas  Lawrence M.D.   On: 01/27/2020 18:55     CBC Recent Labs  Lab 01/10/20 0613 01/11/20 1028 01/12/20 0619 01/13/20 0633 01/14/20 0802  WBC 4.5 7.6 12.0* 9.0 11.9*  HGB 15.5* 14.8 15.3* 13.9 14.0  HCT 46.3* 45.6 47.8* 43.6 43.1  PLT 110* 155 167 164 183  MCV 87.7 89.1 91.2 89.9 88.5  MCH 29.4 28.9 29.2 28.7 28.7  MCHC 33.5 32.5 32.0 31.9 32.5  RDW 13.0 13.2 13.3 13.2 13.0  LYMPHSABS 0.4* 1.0 1.0 0.7 1.0  MONOABS 0.0* 0.1 0.3 0.3 0.3  EOSABS 0.0 0.0 0.0 0.0 0.0  BASOSABS 0.0 0.0 0.0 0.0 0.1    Chemistries  Recent Labs  Lab 01/15/2020 1820 01/14/2020 1821 01/18/2020 1830 01/10/20 0613 01/11/20 1028 01/12/20 0619 01/13/20 0633 01/14/20 0802  NA   < >  --   --  131* 133* 136 137 136  K   < >  --   --  3.6 4.3 4.1 3.8 3.7  CL   < >  --   --  100 100 105 103 102  CO2   < >  --   --  19* 18* 21* 24 24  GLUCOSE   < >  --   --  229* 241* 176* 196* 202*  BUN   < >  --   --  12 15 15 19 18   CREATININE   < >  --   --  0.76 0.82 0.69 0.79 0.62  CALCIUM   < >  --   --  7.9* 7.9* 7.9* 7.9* 8.0*  MG  --  1.9 2.0 2.2 2.3  --   --   --   AST   < >  --   --  77* 77* 69* 53* 45*  ALT   < >  --   --  46* 43 36 32 29  ALKPHOS   < >  --   --  45 39 40 43 49  BILITOT   < >  --   --  0.4 0.5 0.5 0.7 0.8   < > = values in this interval not displayed.   ------------------------------------------------------------------------------------------------------------------ No results for input(s): CHOL, HDL, LDLCALC, TRIG, CHOLHDL, LDLDIRECT in the last 72 hours.  Lab Results  Component Value Date   HGBA1C 6.2 (H) 01/27/2020    ----------------------------------------------------------------------------------------------------------------- Recent Labs    01/13/20 0633 01/14/20 0802  FERRITIN 1,305* 1,194*    Coagulation profile Recent Labs  Lab 01/10/20 0613  INR 1.0    Recent Labs    01/13/20 0633 01/14/20 0802  DDIMER 0.65* 1.25*     ------------------------------------------------------------------------------------------------------------------ No results found for: BNP  CRITICAL CARE Performed by: Vassie Lollarlos Rhianon Zabawa   Total critical care time: 50 minutes  Critical care time was exclusive of separately billable procedures and treating other patients.  Critical care was necessary to treat or prevent imminent or life-threatening deterioration.  Critical care was time spent personally by me on the following activities: development of treatment plan with patient and/or surrogate as well as nursing, discussions with consultants, evaluation of patient's response to treatment, examination of patient, obtaining history  from patient or surrogate, ordering and performing treatments and interventions, ordering and review of laboratory studies, ordering and review of radiographic studies, pulse oximetry and re-evaluation of patient's condition.   Vassie Loll MD on 01/15/2020 at 5:43 PM  Go to [email or web address] - for contact info  [facility] - Office  [phone]

## 2020-01-15 NOTE — Progress Notes (Signed)
Pt has been to be BSC X2 so far during the shift. Tolerated fair. Oxygen levels did drop into the mid 70's but did come up quickly afterwards. Pt has been eating meals sitting up on the side of the bed with no NRB on with oxygen levels in the 80's, and when not eating meals she has been lying on her side with the heated high flow and NRB on. Educating patient the importance of side lying but even more importantly lying prone to help oxygen levels rise and for the lungs to expand. When she does lay on her side her O2 sat's are in the mid to high 90's. Will continue to monitor, MD made aware.

## 2020-01-15 NOTE — Progress Notes (Signed)
Patient called out because she needed to use the bathroom. Patient refusing to try the purewick stating "I can't do that" and asking to get up.  Patient remains on 15L HFNC and 15L NRB.  With minimal activity patient will drop from high 80s to the 60s.  Explained to patient that I would not allow her to get up.  Bedpan provided at this time.  Dr. Camillo Flaming notified of situation.  If patient is unable to void, I am to straight cath x1.

## 2020-01-15 NOTE — Progress Notes (Deleted)
Received call from telemetry that patient's oxygen has dropped to lowers 80s but didn't sustain. Patient oxygen level is currently ranging from 83-90s on 15 liter NRB/HFNC. Patient is unable to tolerate prone but is able to to lay on left side. Pt appear to be winded when sitting on the side of the bed to take her meds. MD made aware via amion chat.

## 2020-01-15 NOTE — Progress Notes (Addendum)
Received call from telemetry that patient's oxygen has dropped to lowers 80s but didn't sustain. Patient oxygen level is currently ranging from 83-90s on 15 liter NRB/HFNC. Patient is unable to tolerate prone but is able to to lay on left side. Pt appear to be winded when sitting on the side of the bed to take her meds. MD made aware of patient's conditions via amion chat and gave order to transfer patient to ICU.

## 2020-01-15 NOTE — Progress Notes (Signed)
Patient not wanting to lay on her side.  Oxygen dropped to the 50s with tachypnea in the 40s after pt took her NRB off to get something to drink.  Pt still doesn't want to lay on her side.  RT called.  Pt placed on Heated High Flow Nasal Cannula.

## 2020-01-15 NOTE — Plan of Care (Signed)
°  Problem: Education: Goal: Knowledge of General Education information will improve Description: Including pain rating scale, medication(s)/side effects and non-pharmacologic comfort measures Outcome: Not Progressing   Problem: Clinical Measurements: Goal: Ability to maintain clinical measurements within normal limits will improve Outcome: Not Progressing   Problem: Clinical Measurements: Goal: Respiratory complications will improve Outcome: Not Progressing   Problem: Activity: Goal: Risk for activity intolerance will decrease Outcome: Not Progressing

## 2020-01-15 NOTE — Progress Notes (Addendum)
At 1645 another RN and the attending RN noticed the patient's oxygen saturations was 29% with a good wave form. Immediately went into the room and patient was agitated, yet lethargic. A Code blue was called for extra support and advice from e-link advised that it was time to intubate. At approx. 1652 the medications were starting to be given, and at 1658 chest compressions were started as no pulses were palpated. After 1 round of Epi and and pulse check, ROSC was achieved and additional medications were given that will be listed on the Code sheet.  Pt was then intubated via Dr. Criss Alvine, and the help of Dr. Gwenlyn Perking and Dr. Casimiro Needle was at bedside also as well as many other nurses and RT's.

## 2020-01-16 ENCOUNTER — Inpatient Hospital Stay (HOSPITAL_COMMUNITY): Payer: BC Managed Care – PPO

## 2020-01-16 DIAGNOSIS — R0902 Hypoxemia: Secondary | ICD-10-CM

## 2020-01-16 DIAGNOSIS — U071 COVID-19: Principal | ICD-10-CM

## 2020-01-16 LAB — CBC
HCT: 40.4 % (ref 36.0–46.0)
Hemoglobin: 12.8 g/dL (ref 12.0–15.0)
MCH: 29 pg (ref 26.0–34.0)
MCHC: 31.7 g/dL (ref 30.0–36.0)
MCV: 91.4 fL (ref 80.0–100.0)
Platelets: 162 10*3/uL (ref 150–400)
RBC: 4.42 MIL/uL (ref 3.87–5.11)
RDW: 13.3 % (ref 11.5–15.5)
WBC: 13.7 10*3/uL — ABNORMAL HIGH (ref 4.0–10.5)
nRBC: 0 % (ref 0.0–0.2)

## 2020-01-16 LAB — POCT I-STAT 7, (LYTES, BLD GAS, ICA,H+H)
Acid-Base Excess: 1 mmol/L (ref 0.0–2.0)
Bicarbonate: 24.9 mmol/L (ref 20.0–28.0)
Calcium, Ion: 1.11 mmol/L — ABNORMAL LOW (ref 1.15–1.40)
HCT: 37 % (ref 36.0–46.0)
Hemoglobin: 12.6 g/dL (ref 12.0–15.0)
O2 Saturation: 100 %
Patient temperature: 99.3
Potassium: 4.3 mmol/L (ref 3.5–5.1)
Sodium: 141 mmol/L (ref 135–145)
TCO2: 26 mmol/L (ref 22–32)
pCO2 arterial: 38.6 mmHg (ref 32.0–48.0)
pH, Arterial: 7.418 (ref 7.350–7.450)
pO2, Arterial: 247 mmHg — ABNORMAL HIGH (ref 83.0–108.0)

## 2020-01-16 LAB — COMPREHENSIVE METABOLIC PANEL
ALT: 138 U/L — ABNORMAL HIGH (ref 0–44)
AST: 91 U/L — ABNORMAL HIGH (ref 15–41)
Albumin: 2.4 g/dL — ABNORMAL LOW (ref 3.5–5.0)
Alkaline Phosphatase: 56 U/L (ref 38–126)
Anion gap: 8 (ref 5–15)
BUN: 20 mg/dL (ref 6–20)
CO2: 23 mmol/L (ref 22–32)
Calcium: 7.7 mg/dL — ABNORMAL LOW (ref 8.9–10.3)
Chloride: 109 mmol/L (ref 98–111)
Creatinine, Ser: 0.9 mg/dL (ref 0.44–1.00)
GFR calc Af Amer: >60 mL/min (ref >60)
GFR calc non Af Amer: >60 mL/min (ref >60)
Glucose, Bld: 201 mg/dL — ABNORMAL HIGH (ref 70–99)
Potassium: 4.3 mmol/L (ref 3.5–5.1)
Sodium: 140 mmol/L (ref 135–145)
Total Bilirubin: 0.8 mg/dL (ref 0.3–1.2)
Total Protein: 5.8 g/dL — ABNORMAL LOW (ref 6.5–8.1)

## 2020-01-16 LAB — GLUCOSE, CAPILLARY
Glucose-Capillary: 152 mg/dL — ABNORMAL HIGH (ref 70–99)
Glucose-Capillary: 163 mg/dL — ABNORMAL HIGH (ref 70–99)
Glucose-Capillary: 191 mg/dL — ABNORMAL HIGH (ref 70–99)
Glucose-Capillary: 196 mg/dL — ABNORMAL HIGH (ref 70–99)
Glucose-Capillary: 209 mg/dL — ABNORMAL HIGH (ref 70–99)

## 2020-01-16 LAB — BLOOD GAS, ARTERIAL
Acid-base deficit: 1.4 mmol/L (ref 0.0–2.0)
Bicarbonate: 23.1 mmol/L (ref 20.0–28.0)
FIO2: 80
O2 Saturation: 93.8 %
Patient temperature: 37
pCO2 arterial: 41 mmHg (ref 32.0–48.0)
pH, Arterial: 7.37 (ref 7.350–7.450)
pO2, Arterial: 76.4 mmHg — ABNORMAL LOW (ref 83.0–108.0)

## 2020-01-16 LAB — TRIGLYCERIDES: Triglycerides: 195 mg/dL — ABNORMAL HIGH (ref <150)

## 2020-01-16 LAB — MRSA PCR SCREENING: MRSA by PCR: NEGATIVE

## 2020-01-16 LAB — CREATININE, SERUM
Creatinine, Ser: 0.91 mg/dL (ref 0.44–1.00)
GFR calc Af Amer: >60 mL/min (ref >60)
GFR calc non Af Amer: >60 mL/min (ref >60)

## 2020-01-16 LAB — PHOSPHORUS: Phosphorus: 3.1 mg/dL (ref 2.5–4.6)

## 2020-01-16 LAB — PROCALCITONIN: Procalcitonin: 0.15 ng/mL

## 2020-01-16 LAB — MAGNESIUM: Magnesium: 2.7 mg/dL — ABNORMAL HIGH (ref 1.7–2.4)

## 2020-01-16 MED ORDER — ROCURONIUM BROMIDE 50 MG/5ML IV SOLN
60.0000 mg | Freq: Once | INTRAVENOUS | Status: AC
  Administered 2020-01-16: 60 mg via INTRAVENOUS

## 2020-01-16 MED ORDER — ROCURONIUM BROMIDE 50 MG/5ML IV SOLN
100.0000 mg | Freq: Once | INTRAVENOUS | Status: AC
  Administered 2020-01-16: 100 mg via INTRAVENOUS

## 2020-01-16 MED ORDER — SODIUM CHLORIDE 0.9 % IV SOLN
2.0000 g | Freq: Three times a day (TID) | INTRAVENOUS | Status: DC
  Administered 2020-01-16 – 2020-01-17 (×3): 2 g via INTRAVENOUS
  Filled 2020-01-16 (×3): qty 2

## 2020-01-16 MED ORDER — FENTANYL CITRATE (PF) 100 MCG/2ML IJ SOLN
200.0000 ug | Freq: Once | INTRAMUSCULAR | Status: AC
  Administered 2020-01-16: 200 ug via INTRAVENOUS

## 2020-01-16 MED ORDER — MIDAZOLAM HCL 2 MG/2ML IJ SOLN
2.0000 mg | Freq: Once | INTRAMUSCULAR | Status: AC
  Administered 2020-01-16: 2 mg via INTRAVENOUS

## 2020-01-16 MED ORDER — INSULIN ASPART 100 UNIT/ML ~~LOC~~ SOLN
0.0000 [IU] | SUBCUTANEOUS | Status: DC
  Administered 2020-01-16 (×2): 4 [IU] via SUBCUTANEOUS
  Administered 2020-01-17 (×2): 7 [IU] via SUBCUTANEOUS
  Administered 2020-01-17: 4 [IU] via SUBCUTANEOUS
  Administered 2020-01-17: 7 [IU] via SUBCUTANEOUS
  Administered 2020-01-17: 11 [IU] via SUBCUTANEOUS
  Administered 2020-01-17: 7 [IU] via SUBCUTANEOUS
  Administered 2020-01-18 (×2): 4 [IU] via SUBCUTANEOUS
  Administered 2020-01-18: 3 [IU] via SUBCUTANEOUS
  Administered 2020-01-18 (×3): 4 [IU] via SUBCUTANEOUS
  Administered 2020-01-19: 3 [IU] via SUBCUTANEOUS
  Administered 2020-01-19 (×4): 4 [IU] via SUBCUTANEOUS
  Administered 2020-01-19: 3 [IU] via SUBCUTANEOUS
  Administered 2020-01-20 (×2): 4 [IU] via SUBCUTANEOUS
  Administered 2020-01-20: 3 [IU] via SUBCUTANEOUS
  Administered 2020-01-20 (×3): 4 [IU] via SUBCUTANEOUS
  Administered 2020-01-21: 3 [IU] via SUBCUTANEOUS
  Administered 2020-01-21: 4 [IU] via SUBCUTANEOUS
  Administered 2020-01-21: 3 [IU] via SUBCUTANEOUS
  Administered 2020-01-21: 4 [IU] via SUBCUTANEOUS
  Administered 2020-01-21 – 2020-01-22 (×3): 3 [IU] via SUBCUTANEOUS
  Administered 2020-01-22: 4 [IU] via SUBCUTANEOUS
  Administered 2020-01-22: 3 [IU] via SUBCUTANEOUS
  Administered 2020-01-22: 8 [IU] via SUBCUTANEOUS
  Administered 2020-01-22 – 2020-01-23 (×3): 4 [IU] via SUBCUTANEOUS
  Administered 2020-01-23: 3 [IU] via SUBCUTANEOUS
  Administered 2020-01-23: 4 [IU] via SUBCUTANEOUS
  Administered 2020-01-23: 3 [IU] via SUBCUTANEOUS
  Administered 2020-01-24 (×2): 4 [IU] via SUBCUTANEOUS
  Administered 2020-01-24: 3 [IU] via SUBCUTANEOUS
  Administered 2020-01-24: 4 [IU] via SUBCUTANEOUS
  Administered 2020-01-24 – 2020-01-25 (×2): 3 [IU] via SUBCUTANEOUS
  Administered 2020-01-25: 4 [IU] via SUBCUTANEOUS
  Administered 2020-01-25: 3 [IU] via SUBCUTANEOUS
  Administered 2020-01-25 – 2020-01-26 (×2): 4 [IU] via SUBCUTANEOUS
  Administered 2020-01-27: 3 [IU] via SUBCUTANEOUS

## 2020-01-16 MED ORDER — ETOMIDATE 2 MG/ML IV SOLN
20.0000 mg | Freq: Once | INTRAVENOUS | Status: AC
  Administered 2020-01-16: 20 mg via INTRAVENOUS

## 2020-01-16 MED ORDER — CHLORHEXIDINE GLUCONATE 0.12% ORAL RINSE (MEDLINE KIT)
15.0000 mL | Freq: Two times a day (BID) | OROMUCOSAL | Status: DC
  Administered 2020-01-16 – 2020-01-27 (×24): 15 mL via OROMUCOSAL

## 2020-01-16 MED ORDER — ROCURONIUM BROMIDE 10 MG/ML (PF) SYRINGE
PREFILLED_SYRINGE | INTRAVENOUS | Status: AC
  Filled 2020-01-16: qty 10

## 2020-01-16 MED ORDER — ORAL CARE MOUTH RINSE
15.0000 mL | OROMUCOSAL | Status: DC
  Administered 2020-01-16 – 2020-01-27 (×112): 15 mL via OROMUCOSAL

## 2020-01-16 MED ORDER — VANCOMYCIN HCL 2000 MG/400ML IV SOLN
2000.0000 mg | Freq: Once | INTRAVENOUS | Status: DC
  Filled 2020-01-16: qty 400

## 2020-01-16 MED ORDER — FENTANYL 2500MCG IN NS 250ML (10MCG/ML) PREMIX INFUSION
0.0000 ug/h | INTRAVENOUS | Status: DC
  Administered 2020-01-16: 150 ug/h via INTRAVENOUS
  Administered 2020-01-17 – 2020-01-27 (×40): 400 ug/h via INTRAVENOUS
  Filled 2020-01-16 (×5): qty 250
  Filled 2020-01-16: qty 500
  Filled 2020-01-16 (×36): qty 250

## 2020-01-16 MED ORDER — ALBUTEROL SULFATE (2.5 MG/3ML) 0.083% IN NEBU
2.5000 mg | INHALATION_SOLUTION | RESPIRATORY_TRACT | Status: DC | PRN
  Filled 2020-01-16: qty 3

## 2020-01-16 MED ORDER — INSULIN DETEMIR 100 UNIT/ML ~~LOC~~ SOLN
15.0000 [IU] | Freq: Two times a day (BID) | SUBCUTANEOUS | Status: DC
  Administered 2020-01-16 – 2020-01-17 (×2): 15 [IU] via SUBCUTANEOUS
  Filled 2020-01-16 (×3): qty 0.15

## 2020-01-16 MED ORDER — PANTOPRAZOLE SODIUM 40 MG PO PACK
40.0000 mg | PACK | Freq: Every day | ORAL | Status: DC
  Administered 2020-01-16: 40 mg
  Filled 2020-01-16: qty 20

## 2020-01-16 MED ORDER — ZONISAMIDE 100 MG PO CAPS
200.0000 mg | ORAL_CAPSULE | Freq: Two times a day (BID) | ORAL | Status: DC
  Administered 2020-01-16: 200 mg via ORAL
  Filled 2020-01-16 (×8): qty 2

## 2020-01-16 MED ORDER — NOREPINEPHRINE 4 MG/250ML-% IV SOLN: 0.0000 ug/min | INTRAVENOUS | Status: DC

## 2020-01-16 MED ORDER — ALBUTEROL SULFATE (2.5 MG/3ML) 0.083% IN NEBU
INHALATION_SOLUTION | RESPIRATORY_TRACT | Status: AC
  Administered 2020-01-16: 2.5 mg
  Filled 2020-01-16: qty 3

## 2020-01-16 MED ORDER — FENTANYL BOLUS VIA INFUSION
50.0000 ug | INTRAVENOUS | Status: DC | PRN
  Administered 2020-01-21 – 2020-01-27 (×7): 50 ug via INTRAVENOUS
  Filled 2020-01-16: qty 50

## 2020-01-16 MED ORDER — MIDAZOLAM HCL 2 MG/2ML IJ SOLN
INTRAMUSCULAR | Status: AC
  Filled 2020-01-16: qty 4

## 2020-01-16 MED ORDER — PANTOPRAZOLE SODIUM 40 MG IV SOLR
40.0000 mg | INTRAVENOUS | Status: DC
  Administered 2020-01-16 – 2020-01-26 (×11): 40 mg via INTRAVENOUS
  Filled 2020-01-16 (×11): qty 40

## 2020-01-16 MED ORDER — PROSOURCE TF PO LIQD
45.0000 mL | Freq: Two times a day (BID) | ORAL | Status: DC
  Administered 2020-01-16 – 2020-01-18 (×4): 45 mL
  Filled 2020-01-16 (×4): qty 45

## 2020-01-16 MED ORDER — VANCOMYCIN HCL IN DEXTROSE 1-5 GM/200ML-% IV SOLN: 1000.0000 mg | Freq: Two times a day (BID) | INTRAVENOUS | Status: DC

## 2020-01-16 MED ORDER — NOREPINEPHRINE 4 MG/250ML-% IV SOLN
INTRAVENOUS | Status: AC
  Filled 2020-01-16: qty 250

## 2020-01-16 MED ORDER — VITAL HIGH PROTEIN PO LIQD
1000.0000 mL | ORAL | Status: DC
  Administered 2020-01-16 – 2020-01-17 (×2): 1000 mL

## 2020-01-16 MED ORDER — BARICITINIB 2 MG PO TABS
4.0000 mg | ORAL_TABLET | Freq: Every day | ORAL | Status: AC
  Administered 2020-01-16 – 2020-01-27 (×12): 4 mg
  Filled 2020-01-16 (×12): qty 2

## 2020-01-16 MED ORDER — LEVETIRACETAM 100 MG/ML PO SOLN
250.0000 mg | Freq: Two times a day (BID) | ORAL | Status: DC
  Administered 2020-01-16 (×2): 250 mg
  Filled 2020-01-16 (×5): qty 2.5
  Filled 2020-01-16 (×2): qty 5
  Filled 2020-01-16: qty 2.5

## 2020-01-16 MED ORDER — FENTANYL CITRATE (PF) 100 MCG/2ML IJ SOLN
50.0000 ug | Freq: Once | INTRAMUSCULAR | Status: AC
  Administered 2020-01-16: 50 ug via INTRAVENOUS

## 2020-01-16 NOTE — Progress Notes (Signed)
Patient with event of self extubation and acute desaturation/resp distress. EDP ( Dr. Jodi Mourning) available and patient re-intubated after 2 attempts. Back on previous ventilatory settings (except for higher FIO2) with stable saturation now. CXR demonstrated air in the base of her neck bilateral and concerns for suspected pneumomediastinum, ETT well positioned and no pneumothorax. There was stable opacity on the right and increased left upper lobe opacity in the interval evaluation.   Plan: -patient is afebrile -but given x-ray findings and increased in her WBC's will broaden antibiotics -patient schedule to be transfer to [facility] 21m ICU.   Vassie Loll MD [phone]

## 2020-01-16 NOTE — Procedures (Signed)
Arterial Catheter Insertion Procedure Note  Jamie Knapp  [id number]  12/05/1975  Date:01/16/20  Time:6:05 PM    Provider Performing: Lorin Glass    Procedure: Insertion of Arterial Line (89373) with US guidance (42876)   Indication(s) Blood pressure monitoring and/or need for frequent ABGs  Consent Unable to obtain consent due to emergent nature of procedure.  Anesthesia None   Time Out Verified patient identification, verified procedure, site/side was marked, verified correct patient position, special equipment/implants available, medications/allergies/relevant history reviewed, required imaging and test results available.   Sterile Technique Maximal sterile technique including full sterile barrier drape, hand hygiene, sterile gown, sterile gloves, mask, hair covering, sterile ultrasound probe cover (if used).   Procedure Description Area of catheter insertion was cleaned with chlorhexidine and draped in sterile fashion. With real-time ultrasound guidance an arterial catheter was placed into the left radial artery.  Appropriate arterial tracings confirmed on monitor.     Complications/Tolerance None; patient tolerated the procedure well.   EBL Minimal   Specimen(s) None

## 2020-01-16 NOTE — Progress Notes (Signed)
Called to come to room due to patient self extubated. When I entered room. RN and CNA were bagging the patient. I proceeded to get everything together for the reintubation. Dr Reather Converse came from the ED to intubate. He did so using a Mac 3 with atttempts x2. 7.5 ETT  @ 24 at the lip was placed with postitive color change on the CO2 detector. Xray was done to confirm placement. BBS noted and equal chest rise as well. Placed back on the ventilator with previous settings and increased FIO2.

## 2020-01-16 NOTE — Progress Notes (Signed)
Brief Nutrition Note RD working remotely.   Consult received for enteral/tube feeding initiation and management.  Patient is currently intubated with OGT (gastric) place 9/17 at 1800.Marland Kitchen She is COVID-19 positive.   Adult Enteral Nutrition Protocol initiated. Full assessment to follow.  Admitting Dx: Acute respiratory failure with hypoxia (HCC) [J96.01] Pneumonia due to COVID-19 virus [U07.1, J12.82] COVID-19 [U07.1]  Body mass index is 38.37 kg/m. Pt meets criteria for obesity based on current BMI.  Labs:  Recent Labs  Lab Feb 08, 2020 1820 Feb 08, 2020 1830 01/10/20 8295 01/10/20 6213 01/11/20 1028 01/12/20 0619 01/13/20 0865 01/13/20 7846 01/14/20 0802 01/16/20 0450 01/16/20 0752  NA   < >  --  131*   < > 133*   < > 137  --  136  --  140  K   < >  --  3.6   < > 4.3   < > 3.8  --  3.7  --  4.3  CL   < >  --  100   < > 100   < > 103  --  102  --  109  CO2   < >  --  19*   < > 18*   < > 24  --  24  --  23  BUN   < >  --  12   < > 15   < > 19  --  18  --  20  CREATININE   < >  --  0.76   < > 0.82   < > 0.79   < > 0.62 0.91 0.90  CALCIUM   < >  --  7.9*   < > 7.9*   < > 7.9*  --  8.0*  --  7.7*  MG  --  2.0 2.2  --  2.3  --   --   --   --   --   --   PHOS  --  3.1  --   --   --   --   --   --   --   --   --   GLUCOSE   < >  --  229*   < > 241*   < > 196*  --  202*  --  201*   < > = values in this interval not displayed.       Trenton Gammon, MS, RD, LDN, CNSC Inpatient Clinical Dietitian RD pager # available in [facility]  After hours/weekend pager # available in [facility]

## 2020-01-16 NOTE — ED Provider Notes (Signed)
.  Critical Care Performed by: Elnora Morrison, MD Authorized by: Elnora Morrison, MD   Critical care provider statement:    Critical care time (minutes):  31   Critical care start time:  01/16/2020 11:30 PM   Critical care end time:  01/16/2020 12:01 PM   Critical care time was exclusive of:  Separately billable procedures and treating other patients and teaching time   Critical care was necessary to treat or prevent imminent or life-threatening deterioration of the following conditions:  Respiratory failure   Critical care was time spent personally by me on the following activities:  Discussions with consultants, evaluation of patient's response to treatment, examination of patient, ordering and performing treatments and interventions, pulse oximetry and re-evaluation of patient's condition Procedure Name: Intubation Date/Time: 01/16/2020 12:34 PM Performed by: Elnora Morrison, MD Pre-anesthesia Checklist: Patient identified, Patient being monitored, Emergency Drugs available, Timeout performed and Suction available Oxygen Delivery Method: Non-rebreather mask Preoxygenation: Pre-oxygenation with 100% oxygen Induction Type: Rapid sequence Ventilation: Mask ventilation without difficulty Laryngoscope Size: Mac Grade View: Grade II Tube size: 7.5 mm Number of attempts: 2 Placement Confirmation: ETT inserted through vocal cords under direct vision,  CO2 detector and Breath sounds checked- equal and bilateral Secured at: 24 cm Tube secured with: ETT holder     Called emergently ICU for pericode, patient's oxygen saturation 40%, she pulled her ET tube.  Brief discussion with hospital staff she has Covid pneumonia, hypoxia and was doing fairly well this morning.  After prolonged bagging, preparing suction, medications, communicating with nursing staff we were able to intubate with two attempts.  CO2 changed.  Further observation as oxygen in the 40s for prolonged time and considered other reasons.   We were able to adjust ventilator to get patient improving into the 70s.  Chest x-ray ordered.  Discussed with admitting staff.  Golda Acre, MD 01/16/20 [phone]

## 2020-01-16 NOTE — Procedures (Signed)
Central Venous Catheter Insertion Procedure Note  Jamie Knapp  [id number]  04/06/1976  Date:01/16/20  Time:6:04 PM   Provider Performing:Zya Finkle Salena Saner Katrinka Blazing   Procedure: Insertion of Non-tunneled Central Venous Catheter(36556) with US guidance (03013)   Indication(s) Medication administration  Consent Unable to obtain consent due to emergent nature of procedure. Called mother after who agreed with plan.  Anesthesia Topical only with 1% lidocaine   Timeout Verified patient identification, verified procedure, site/side was marked, verified correct patient position, special equipment/implants available, medications/allergies/relevant history reviewed, required imaging and test results available.  Sterile Technique Maximal sterile technique including full sterile barrier drape, hand hygiene, sterile gown, sterile gloves, mask, hair covering, sterile ultrasound probe cover (if used).  Procedure Description Area of catheter insertion was cleaned with chlorhexidine and draped in sterile fashion.  With real-time ultrasound guidance a central venous catheter was placed into the left internal jugular vein. Nonpulsatile blood flow and easy flushing noted in all ports.  The catheter was sutured in place and sterile dressing applied.  Complications/Tolerance None; patient tolerated the procedure well. Chest X-ray is ordered to verify placement for internal jugular or subclavian cannulation.   Chest x-ray is not ordered for femoral cannulation.  EBL Minimal  Specimen(s) None

## 2020-01-16 NOTE — Progress Notes (Signed)
Patient Demographics:    Jamie Knapp, is a 44 y.o. female, DOB - [birth year], BHA:[id number]  Admit date - 01/15/2020   Admitting Physician Bernadette Hoit, DO  Outpatient Primary MD for the patient is Cari Caraway, MD  LOS - 7   Chief Complaint  Patient presents with  . covid        Subjective:    Jamie Knapp afebrile currently, sedated, intubated and mechanically ventilated. Remains intermittently tachypneic and with ventilator settings challenges as per RT reports.    Assessment  & Plan :    Principal Problem:   COVID-19 Active Problems:   Acute respiratory failure with hypoxia (HCC)   Thrombocytopenia (HCC)   Hypokalemia   SIRS (systemic inflammatory response syndrome) (HCC)   Essential hypertension   Seizures (HCC)   Obesity (BMI 30-39.9)   Borderline type 2 diabetes mellitus   Hyperglycemia   Prolonged QT interval   Diarrhea   Endotracheal tube present  Brief Summary:- 44 y.o. female with medical history significant for hypertension and seizures admitted on 01/26/2020 with acute hypoxic respiratory failure secondary to Covid pneumonia -Patient received vaccination first dose on 12/31/2019, tested positive for COVID-19 on 01/03/2020  A/p 1)Acute Hypoxic Respiratory Failure secondary to COVID-19 infection/Pneumonia--- The treatment plan and use of medications  for treatment of COVID-19 infection and possible side effects were discussed with patient/family -Cough, shortness of breath and hypoxia persist -----Patient/Family verbalizes understanding and agrees to treatment protocols  --Patient is positive for COVID-19 infection, chest x-ray with findings of infiltrates/opacities,  patient is tachypneic/hypoxic and requiring continuous supplemental oxygen---patient meets criteria for initiation of Remdesivir AND Steroid therapy per protocol  -Continue to trend inflammatory markers  including D-dimer, ferritin and  CRP---also follow CBC and CMP --Supplemental oxygen to keep O2 sats above 92% -Follow serial chest x-rays and ABGs as indicated. --- Once again patient has been instructed to follow prone positioning for more than 16 hours/day in increments of 2 to 3 hours at a time if able to tolerate. --Attempt to maintain euvolemic state.  Continue gentle diuresis with Lasix. --Continue the use of zinc and vitamin C as ordered -Continue as needed bronchodilators -Continue prophylactic PPI while on high-dose steroids -Enhanced dosage of anticoagulant for DVT prophylaxis given hypercoagulable state with COVID-19 infections. -continue high dose solumedrol and barcitinib (last one day 3/10) -Continue as needed antitussives  2)Elevated LFTs  -AST 64 >> 77 ALT 39 >> 43 T Bili 0.7 >> 0.5 -Continue to follow LFTs trend intermittently  -remdesivir therapy has been completed on 01/14/20.  3)Diarrhea--C. difficile negative, suspect diarrhea secondary to Covid 19  Infection -Good response and improvement to the use of cholestyramine -Patient reported no having significant diarrhea at this time.  4)DM2--- anticipate worsening hyperglycemia due to steroids,  -Continue to hold Metformin while inpatient --Continue sliding scale insulin and lantus -follow CBGs; Anticipating increase blood sugar levels with the use of his steroids. -A1C 6.2   5)History of Seizures--- no recent seizures- -continue Keppra.  6) hypokalemia--- due to diarrhea/GI losses compounded by HCTZ use -Continue to follow electrolytes trend and further replete as needed.  7) prolonged QT syndrome--- keep potassium close to 4, keep magnesium close to 2 -Avoid QT prolonging agents. -Has remained stable  -continue telemetry monitoring  for now.   8)HTN--continue holding lisinopril and HCTZ due to profuse diarrhea with risk for dehydration and AKI. -continue lasix as BP allows it -Continue IV labetalol when  necessary  Every 4 hours for systolic blood pressure over 160 mmhg -Vital signs stable  9) thrombocytopenia- -platelets count improved.   -on Lovenox for DVT prophylaxis. -continue to follow platelets count -no signs of overt bleeding.   10-acute resp failure with hypoxia -patient with acute decompensation and resp arrest on 01/15/20 -continue to required intubation and mechanical ventilatory support  -FIO2 80%, PEEP 14, RR 35 and Vol 500 -pending transfer to ICU at [facility] for further evaluation and intervention and critical care service.   11-transient A. Fib/SVT/cardiac arrest -following resp arrest event on 01/15/20 -transient CPR, epinephrine, adenosine and metoprolol required -patient back in sinus rhythm -continue telemtry monitoring -follow electrolytes and replete as needed.    Disposition/Need for in-Hospital Stay- patient unable to be discharged at this time due to --acute hypoxic respiratory failure secondary to Covid pneumonia requiring IV steroids, intubation/ventilatory support and barcitinib. Patient waiting to be [facility] to [facility] under PCCM service for further evaluation and management.   Status is: Inpatient  Disposition: The patient is from: Home              Anticipated d/c is to: to be determined.               Anticipated d/c date is: To be determined              Patient currently is not medically stable to d/c. Barriers: Not Clinically Stable- -acute hypoxic respiratory failure secondary to Covid pneumonia requiring IV steroids, starting barcitinib and needing now ventilatory support and IV steroids.   Plan of care discussed with patient's mother over the phone with full details and all questions answered on 01/15/20 and 01/16/20.  Code Status : Full code  Family Communication:   (patient is alert, awake and coherent)    Consults  : na  DVT Prophylaxis  :  Lovenox -    Lab Results  Component Value Date   PLT 162 01/16/2020    Inpatient  Medications  Scheduled Meds: . ascorbic acid  500 mg Per Tube Daily  . baricitinib  4 mg Per Tube Daily  . chlorhexidine gluconate (MEDLINE KIT)  15 mL Mouth Rinse BID  . Chlorhexidine Gluconate Cloth  6 each Topical Daily  . docusate  100 mg Per Tube BID  . doxycycline  100 mg Per Tube Q12H  . enoxaparin (LOVENOX) injection  55 mg Subcutaneous Q24H  . insulin aspart  0-15 Units Subcutaneous TID WC  . insulin aspart  0-5 Units Subcutaneous QHS  . insulin detemir  12 Units Subcutaneous BID  . levETIRAcetam  250 mg Per Tube BID  . lidocaine  1 patch Transdermal Q24H  . mouth rinse  15 mL Mouth Rinse 10 times per day  . methylPREDNISolone (SOLU-MEDROL) injection  60 mg Intravenous Q8H  . pantoprazole sodium  40 mg Per Tube Daily  . polyethylene glycol  17 g Per Tube Daily  . zinc sulfate  220 mg Per Tube Daily  . zonisamide  200 mg Oral BID   Continuous Infusions: . sodium chloride Stopped (01/11/20 2129)  . cefTRIAXone (ROCEPHIN)  IV 200 mL/hr at 01/15/20 1838  . propofol (DIPRIVAN) infusion 50 mcg/kg/min (01/16/20 0844)   PRN Meds:.sodium chloride, albuterol, chlorpheniramine-HYDROcodone, fentaNYL (SUBLIMAZE) injection, fentaNYL (SUBLIMAZE) injection, guaiFENesin-dextromethorphan, labetalol, phenol, prochlorperazine    Anti-infectives (  From admission, onward)   Start     Dose/Rate Route Frequency Ordered Stop   01/15/20 2200  doxycycline (VIBRA-TABS) tablet 100 mg        100 mg Per Tube Every 12 hours 01/15/20 2134 01/16/20 1238   01/11/20 2200  doxycycline (VIBRA-TABS) tablet 100 mg  Status:  Discontinued        100 mg Oral Every 12 hours 01/11/20 1643 01/15/20 2134   01/11/20 1645  cefTRIAXone (ROCEPHIN) 1 g in sodium chloride 0.9 % 100 mL IVPB        1 g 200 mL/hr over 30 Minutes Intravenous Every 24 hours 01/11/20 1643     01/10/20 1000  remdesivir 100 mg in sodium chloride 0.9 % 100 mL IVPB       "Followed by" Linked Group Details   100 mg 200 mL/hr over 30 Minutes  Intravenous Daily 01/23/2020 2105 01/14/20 0959   01/27/2020 2145  remdesivir 100 mg in sodium chloride 0.9 % 100 mL IVPB       "Followed by" Linked Group Details   100 mg 200 mL/hr over 30 Minutes Intravenous  Once 01/18/2020 2105 01/12/2020 2215   01/17/2020 2115  remdesivir 100 mg in sodium chloride 0.9 % 100 mL IVPB       "Followed by" Linked Group Details   100 mg 200 mL/hr over 30 Minutes Intravenous  Once 12/30/2019 2105 01/24/2020 2235        Objective:   Vitals:   01/16/20 0700 01/16/20 0800 01/16/20 0834 01/16/20 0900  BP: (!) 102/59 96/63  118/69  Pulse: (!) 101 (!) 102  (!) 121  Resp: (!) 36 (!) 31  (!) 44  Temp:   98.3 F (36.8 C)   TempSrc:   Axillary   SpO2: 100% 100%  (!) 68%  Weight:      Height:        Wt Readings from Last 3 Encounters:  01/07/2020 111.1 kg  02/20/16 107.7 kg  10/22/14 106.6 kg     Intake/Output Summary (Last 24 hours) at 01/16/2020 0950 Last data filed at 01/16/2020 0900 Gross per 24 hour  Intake 761.57 ml  Output --  Net 761.57 ml     Physical Exam General exam: currently afebrile, sedated, intubated and mechanically ventilated. MEWS score of 6 and with some desaturation and ventilator settings challenges overnight reported. Currently requiring FIO@ 80%, PEEP 14, RR, 35 and volume 500. Respiratory system: mild exp wheezing, no frank crackles, decrease BS at bases, positive rhonchi bilaterally.  Cardiovascular system: sinus tachycardia, no rubs, no gallops, no murmurs, unable to assess JVD with her body habitus.  Gastrointestinal system: Abdomen is nondistended, soft and nontender. No organomegaly or masses felt. Normal bowel sounds heard. Central nervous system: move limbs spontaneously,unable to properly assess with current sedation.  Extremities: No cyanosis, no clubbing, trace edema bilaterally.  Skin: No rashes, no petechiae.  Psychiatry: unable to proper assess with current sedation, per nursing staff reports, requiring intermittent doses of  fentanyl due to agitation. Patient receiving propofol for sedation currently.      Data Review:   Micro Results Recent Results (from the past 240 hour(s))  SARS Coronavirus 2 by RT PCR (hospital order, performed in [facility] hospital lab) Nasopharyngeal Nasopharyngeal Swab     Status: Abnormal   Collection Time: 01/19/2020  6:15 PM   Specimen: Nasopharyngeal Swab  Result Value Ref Range Status   SARS Coronavirus 2 POSITIVE (A) NEGATIVE Final    Comment: RESULT CALLED TO,  READ BACK BY AND VERIFIED WITH: J SAPPELT AT 1930 ON 01/03/2020 BY MOSLEY,J (NOTE) SARS-CoV-2 target nucleic acids are DETECTED  SARS-CoV-2 RNA is generally detectable in upper respiratory specimens  during the acute phase of infection.  Positive results are indicative  of the presence of the identified virus, but do not rule out bacterial infection or co-infection with other pathogens not detected by the test.  Clinical correlation with patient history and  other diagnostic information is necessary to determine patient infection status.  The expected result is negative.  Fact Sheet for Patients:   BoilerBrush.com.cy   Fact Sheet for Healthcare Providers:   https://pope.com/    This test is not yet approved or cleared by the [place] FDA and  has been authorized for detection and/or diagnosis of SARS-CoV-2 by FDA under an Emergency Use Authorization (EUA).  This EUA will remain in effect (meaning  this test can be used) for the duration of  the COVID-19 declaration under Section 564(b)(1) of the Act, 21 U.S.C. section 360-bbb-3(b)(1), unless the authorization is terminated or revoked sooner.  Performed at [facility], [address], [place], [place] [address]   Blood Culture (routine x 2)     Status: None   Collection Time: 12/31/2019  6:20 PM   Specimen: Right Antecubital; Blood  Result Value Ref Range Status   Specimen Description RIGHT ANTECUBITAL   Final   Special Requests   Final    BOTTLES DRAWN AEROBIC AND ANAEROBIC Blood Culture adequate volume   Culture   Final    NO GROWTH 6 DAYS Performed at [facility], [address], [place], [place] [address]    Report Status 01/15/2020 FINAL  Final  Blood Culture (routine x 2)     Status: None   Collection Time: 01/27/2020  6:30 PM   Specimen: Left Antecubital; Blood  Result Value Ref Range Status   Specimen Description LEFT ANTECUBITAL  Final   Special Requests   Final    BOTTLES DRAWN AEROBIC AND ANAEROBIC Blood Culture adequate volume   Culture   Final    NO GROWTH 6 DAYS Performed at [facility], [address], [place], [place] [address]    Report Status 01/15/2020 FINAL  Final  C Difficile Quick Screen w PCR reflex     Status: None   Collection Time: 01/10/20  2:50 AM   Specimen: STOOL  Result Value Ref Range Status   C Diff antigen NEGATIVE NEGATIVE Corrected    Comment: CORRECTED ON 09/12 AT 1820: PREVIOUSLY REPORTED AS NON REACTIVE   C Diff toxin NEGATIVE NEGATIVE Corrected    Comment: CORRECTED ON 09/12 AT 1820: PREVIOUSLY REPORTED AS NON REACTIVE   C Diff interpretation No C. difficile detected.  Corrected    Comment: Performed at [facility], [address], [place], [place] [address] CORRECTED ON 09/12 AT 1820: PREVIOUSLY REPORTED AS VALID   MRSA PCR Screening     Status: None   Collection Time: 01/15/20  1:25 AM   Specimen: Nasal Mucosa; Nasopharyngeal  Result Value Ref Range Status   MRSA by PCR NEGATIVE NEGATIVE Final    Comment:        The GeneXpert MRSA Assay (FDA approved for NASAL specimens only), is one component of a comprehensive MRSA colonization surveillance program. It is not intended to diagnose MRSA infection nor to guide or monitor treatment for MRSA infections. Performed at [facility], [address] [place], [place] [address]     Radiology Reports DG CHEST  PORT 1 VIEW  Result Date: 01/15/2020 CLINICAL DATA:  COVID-19 infection.  NG  tube placement EXAM: PORTABLE CHEST 1 VIEW COMPARISON:  January 15, 2020 FINDINGS: The ETT is in good position. The cardiomediastinal silhouette is stable. No pneumothorax. Pulmonary opacities, left greater than right, have improved on today's study. The NG tube terminates in the stomach, in good position. IMPRESSION: Support apparatus as above. Pulmonary opacities are less prominent on today's study. Electronically Signed   By: Dorise Bullion III M.D   On: 01/15/2020 22:13   Portable Chest x-ray  Result Date: 01/15/2020 CLINICAL DATA:  COVID-19 positive. Code blue. ET and OG tube placement EXAM: PORTABLE CHEST 1 VIEW COMPARISON:  January 15, 2020 FINDINGS: An ET tube is been placed in the interval in terminates in good position. The distal tip of the NG tube is in the upper left abdomen, within the body of the stomach. The side port is probably just below the GE junction. Diffuse bilateral pulmonary infiltrates, left greater than right, persist. No other interval changes. No pneumothorax. IMPRESSION: 1. ETT placement as above, in good position. 2. The side port of the NG tube appears to terminate just below the GE junction. The patient may benefit from advancing several cm. 3. Persistent diffuse bilateral pulmonary infiltrates, left greater than right. Electronically Signed   By: Dorise Bullion III M.D   On: 01/15/2020 18:13   DG CHEST PORT 1 VIEW  Result Date: 01/15/2020 CLINICAL DATA:  Hypoxia, COVID EXAM: PORTABLE CHEST 1 VIEW COMPARISON:  01/16/2020 FINDINGS: Diffuse bilateral airspace disease is worsening since prior study. Cardiomegaly. Possible small bilateral effusions. No pneumothorax. No acute bony abnormality. IMPRESSION: Worsening diffuse bilateral airspace disease. Possible small effusions. Electronically Signed   By: Rolm Baptise M.D.   On: 01/15/2020 05:05   DG Chest Port 1 View  Result Date: 01/18/2020 CLINICAL DATA:  44 year old with cough, shortness of breath and hypoxia.  COVID-19 positive. EXAM: PORTABLE CHEST 1 VIEW COMPARISON:  None. FINDINGS: Cardiac silhouette normal in size for AP portable technique. Patchy and confluent airspace opacities throughout both lungs. Pulmonary vascularity normal. No pleural effusions. IMPRESSION: Acute multilobar pneumonia involving both lungs. Electronically Signed   By: Evangeline Dakin M.D.   On: 01/19/2020 18:55     CBC Recent Labs  Lab 01/10/20 7782 01/10/20 4235 01/11/20 1028 01/12/20 0619 01/13/20 0633 01/14/20 0802 01/16/20 0752  WBC 4.5   < > 7.6 12.0* 9.0 11.9* 13.7*  HGB 15.5*   < > 14.8 15.3* 13.9 14.0 12.8  HCT 46.3*   < > 45.6 47.8* 43.6 43.1 40.4  PLT 110*   < > 155 167 164 183 162  MCV 87.7   < > 89.1 91.2 89.9 88.5 91.4  MCH 29.4   < > 28.9 29.2 28.7 28.7 29.0  MCHC 33.5   < > 32.5 32.0 31.9 32.5 31.7  RDW 13.0   < > 13.2 13.3 13.2 13.0 13.3  LYMPHSABS 0.4*  --  1.0 1.0 0.7 1.0  --   MONOABS 0.0*  --  0.1 0.3 0.3 0.3  --   EOSABS 0.0  --  0.0 0.0 0.0 0.0  --   BASOSABS 0.0  --  0.0 0.0 0.0 0.1  --    < > = values in this interval not displayed.    [place]  Lab 01/16/2020 1820 01/01/2020 1821 01/25/2020 1830 01/10/20 3614 01/10/20 4315 01/11/20 1028 01/11/20 1028 01/12/20 4008 01/13/20 6761 01/14/20 0802 01/16/20 0450 01/16/20 9509  NA   < >  --   --  131*   < > 133*  --  136 137 136  --  140  K   < >  --   --  3.6   < > 4.3  --  4.1 3.8 3.7  --  4.3  CL   < >  --   --  100   < > 100  --  105 103 102  --  109  CO2   < >  --   --  19*   < > 18*  --  21* 24 24  --  23  GLUCOSE   < >  --   --  229*   < > 241*  --  176* 196* 202*  --  201*  BUN   < >  --   --  12   < > 15  --  $R'15 19 18  'dG$ --  20  CREATININE   < >  --   --  0.76   < > 0.82   < > 0.69 0.79 0.62 0.91 0.90  CALCIUM   < >  --   --  7.9*   < > 7.9*  --  7.9* 7.9* 8.0*  --  7.7*  MG  --  1.9 2.0 2.2  --  2.3  --   --   --   --   --   --   AST   < >  --   --  77*   < > 77*  --  69* 53* 45*  --  91*  ALT   < >  --   --   46*   < > 43  --  36 32 29  --  138*  ALKPHOS   < >  --   --  45   < > 39  --  40 43 49  --  56  BILITOT   < >  --   --  0.4   < > 0.5  --  0.5 0.7 0.8  --  0.8   < > = values in this interval not displayed.   ------------------------------------------------------------------------------------------------------------------ Recent Labs    01/16/20 0451  TRIG 195*    Lab Results  Component Value Date   HGBA1C 6.2 (H) 01/27/2020   ----------------------------------------------------------------------------------------------------------------- Recent Labs    01/14/20 0802  FERRITIN 1,194*    Coagulation profile Recent Labs  Lab 01/10/20 0613  INR 1.0    Recent Labs    01/14/20 0802  DDIMER 1.25*     ------------------------------------------------------------------------------------------------------------------ No results found for: BNP  CRITICAL CARE Performed by: Barton Dubois   Total critical care time: 40 minutes  Critical care time was exclusive of separately billable procedures and treating other patients.  Critical care was necessary to treat or prevent imminent or life-threatening deterioration.  Critical care was time spent personally by me on the following activities: development of treatment plan with patient and/or surrogate as well as nursing, discussions with consultants, evaluation of patient's response to treatment, examination of patient, obtaining history from patient or surrogate, ordering and performing treatments and interventions, ordering and review of laboratory studies, ordering and review of radiographic studies, pulse oximetry and re-evaluation of patient's condition.   Barton Dubois MD on 01/16/2020 at 9:50 AM  Go to [email or web address] - for contact info  [facility] - Office  [phone]

## 2020-01-16 NOTE — Progress Notes (Signed)
RT. Note RT called to bedside due to desaturation to low 70, Turned pt. Back to 8cc, pt. Saturation to 95, also did 1 alveolar recruitment. Pt. Left on will try to titrate down to 6cc in 1hr.

## 2020-01-16 NOTE — H&P (Signed)
NAME:  Jamie Knapp, MRN:  [id number], DOB:  05/08/1975, LOS: 7 ADMISSION DATE:  01/12/2020, CONSULTATION DATE:  01/16/20 REFERRING MD:  Gwenlyn Perking, CHIEF COMPLAINT:  SOB   Brief History   44 year old woman with hx of HTN and seizures presenting with COVID ARDS.  History of present illness   44 year old woman with hx of HTN and seizures presenting 9/11 to [facility] ER with SOB worsening over previous ~10 days.  Initial +COVID diagnosis 01/03/20.  Stayed at [facility] for a few days but unfortunately progressed to needing intubation 9/17. She was then transferred to [facility] for further care.  History per chart review, patient intubated and sedated.  Past Medical History  HTN Seizure dx  Significant Hospital Events   9/11 [facility] ER 9/17 Intubated  Consults:  None  Procedures:  N/A  Significant Diagnostic Tests:  CXR: ARDS pattern, pneumomediastinum, no obvious PTX  Micro Data:  COVID + 9/5 MRSA PCR neg C diff neg  Antimicrobials:   Doxycycline 9/13>>9/17 Ceftriaxone 9/13>>9/17 Cefepime  9/18>> Barcitinib 9/16>>  Interim history/subjective:  Admitting  Objective   Blood pressure 96/70, pulse 92, temperature 99.2 F (37.3 C), temperature source Oral, resp. rate (!) 28, height 5\' 7"  (1.702 m), weight 111.1 kg, SpO2 93 %.    Vent Mode: PRVC FiO2 (%):  [70 %-100 %] 100 % Set Rate:  [35 bmp] 35 bmp Vt Set:  [490 mL-500 mL] 500 mL PEEP:  [14 cmH20] 14 cmH20 Plateau Pressure:  [27 cmH20-34 cmH20] 34 cmH20   Intake/Output Summary (Last 24 hours) at 01/16/2020 1742 Last data filed at 01/16/2020 1600 Gross per 24 hour  Intake 1017.93 ml  Output --  Net 1017.93 ml   Filed Weights   01/23/2020 1731  Weight: 111.1 kg    Examination: General: Ill woman on vent HENT: ETT in place, minimal secretions Lungs: Clear, no wheezing, subcutaneous crepitus worst on left chest wall Cardiovascular: RRR, ext warm Abdomen: Soft, hypoactive BS Extremities: No edema Neuro: sedated/paralyzed GU: foley  with clear urine  CMP benign with exception of mild transaminitis CBC benign and stable ABG pending CXR: ARDS pattern, pneumomediastinum, no obvious PTX  Resolved Hospital Problem list   N/A  Assessment & Plan:  Acute hypoxemic respiratory failure due to COVID ARDS. - ARDSnet PEEP/FiO2, attempt to limit driving pressures as allowed by lung compliance - VAP prevention bundle - Remdesivir, barcitinib, methylprednisolone as ordered -16/8h proning with A/a gradients < 150, adjust based on clinical response -Sedation to minimize shearing forces created by ventilator assynchrony - Keep even as able with balanced diuresis as tolerated by renal function - 9/18: check ABG, consider proning, lasix 40mg  IV x 2  Pneumomediastinum- not much to do with this other than maintain vent synchrony to reduce pressure swings - AM CXR  Need for sedation with mechanical ventilation - Propofol/fentanyl, monitor vent synchrony, patient comfort, and TG  HTN- hypotensive with sedation - Hold home antihypertensives  Hx seizures- continue PTA keppra  Prediabetes with hyperglycemia induced by steroids - Levemir, SSI, goal 100-180 CBG  Best practice:  Diet: Start TF Pain/Anxiety/Delirium protocol (if indicated): Propofol/fentanyl VAP protocol (if indicated): in place 9/18 DVT prophylaxis: lovenox 0.5mg /kg q24h GI prophylaxis: PPI Glucose control: see above Mobility: BR Code Status: full Family Communication: Called and updated mother Vayda Dungee 10/18 01/16/20 Disposition: ICU  Labs   CBC: Recent Labs  Lab 01/10/20 0613 01/10/20 0613 01/11/20 1028 01/12/20 0619 01/13/20 0633 01/14/20 0802 01/16/20 0752  WBC 4.5   < >  7.6 12.0* 9.0 11.9* 13.7*  NEUTROABS 4.0  --  6.4 10.6* 8.0* 10.3*  --   HGB 15.5*   < > 14.8 15.3* 13.9 14.0 12.8  HCT 46.3*   < > 45.6 47.8* 43.6 43.1 40.4  MCV 87.7   < > 89.1 91.2 89.9 88.5 91.4  PLT 110*   < > 155 167 164 183 162   < > = values in this  interval not displayed.    Basic Metabolic Panel: Recent Labs  Lab 02/01/20 1820 02-01-20 1821 02/01/2020 1830 01/10/20 1829 01/10/20 9371 01/11/20 1028 01/11/20 1028 01/12/20 0619 01/13/20 6967 01/14/20 0802 01/16/20 0450 01/16/20 0752  NA   < >  --   --  131*   < > 133*  --  136 137 136  --  140  K   < >  --   --  3.6   < > 4.3  --  4.1 3.8 3.7  --  4.3  CL   < >  --   --  100   < > 100  --  105 103 102  --  109  CO2   < >  --   --  19*   < > 18*  --  21* 24 24  --  23  GLUCOSE   < >  --   --  229*   < > 241*  --  176* 196* 202*  --  201*  BUN   < >  --   --  12   < > 15  --  15 19 18   --  20  CREATININE   < >  --   --  0.76   < > 0.82   < > 0.69 0.79 0.62 0.91 0.90  CALCIUM   < >  --   --  7.9*   < > 7.9*  --  7.9* 7.9* 8.0*  --  7.7*  MG  --  1.9 2.0 2.2  --  2.3  --   --   --   --   --   --   PHOS  --   --  3.1  --   --   --   --   --   --   --   --   --    < > = values in this interval not displayed.   GFR: Estimated Creatinine Clearance: 102.5 mL/min (by C-G formula based on SCr of 0.9 mg/dL). Recent Labs  Lab Feb 01, 2020 1820 02-01-20 2206 01/10/20 03/11/20 01/12/20 0619 01/13/20 0633 01/14/20 0802 01/16/20 0752  PROCALCITON <0.10  --   --   --   --   --   --   WBC 5.0  --    < > 12.0* 9.0 11.9* 13.7*  LATICACIDVEN 1.2 1.5  --   --   --   --   --    < > = values in this interval not displayed.    Liver Function Tests: Recent Labs  Lab 01/11/20 1028 01/12/20 0619 01/13/20 0633 01/14/20 0802 01/16/20 0752  AST 77* 69* 53* 45* 91*  ALT 43 36 32 29 138*  ALKPHOS 39 40 43 49 56  BILITOT 0.5 0.5 0.7 0.8 0.8  PROT 6.5 6.1* 6.1* 6.1* 5.8*  ALBUMIN 3.1* 2.8* 2.6* 2.5* 2.4*   No results for input(s): LIPASE, AMYLASE in the last 168 hours. No results for input(s): AMMONIA in the last 168 hours.  ABG  Component Value Date/Time   PHART 7.370 01/15/2020 2357   PCO2ART 41.0 01/15/2020 2357   PO2ART 76.4 (L) 01/15/2020 2357   HCO3 23.1 01/15/2020 2357    ACIDBASEDEF 1.4 01/15/2020 2357   O2SAT 93.8 01/15/2020 2357     Coagulation Profile: Recent Labs  Lab 01/10/20 0613  INR 1.0    Cardiac Enzymes: No results for input(s): CKTOTAL, CKMB, CKMBINDEX, TROPONINI in the last 168 hours.  HbA1C: Hgb A1c MFr Bld  Date/Time Value Ref Range Status  01/22/2020 10:06 PM 6.2 (H) 4.8 - 5.6 % Final    Comment:    (NOTE) Pre diabetes:          5.7%-6.4%  Diabetes:              >6.4%  Glycemic control for   <7.0% adults with diabetes     CBG: Recent Labs  Lab 01/15/20 1836 01/15/20 2117 01/16/20 0835 01/16/20 1202 01/16/20 1653  GLUCAP 285* 248* 191* 196* 152*    Review of Systems:   Cannot assess, intubated/sedated  Past Medical History  She,  has a past medical history of Deviated nasal septum (10/2015), History of kidney stones (09/2014), Seizures (HCC), and Sinusitis (10/2015).   Surgical History    Past Surgical History:  Procedure Laterality Date  . NASAL SINUS SURGERY Bilateral 02/20/2016   Procedure: BILATERAL ENDOSCOPIC MAXILLARY ANTROSTOMY;  Surgeon: Newman Pies, MD;  Location: [facility];  Service: ENT;  Laterality: Bilateral;  . SEPTOPLASTY N/A 02/20/2016   Procedure: SEPTOPLASTY;  Surgeon: Newman Pies, MD;  Location:  SURGERY CENTER;  Service: ENT;  Laterality: N/A;  . WISDOM TOOTH EXTRACTION       Social History   reports that she has never smoked. She has never used smokeless tobacco. She reports that she does not drink alcohol and does not use drugs.   Family History   Her family history includes Cancer in her paternal grandmother.   Allergies Allergies  Allergen Reactions  . Bee Venom Swelling     Home Medications  Prior to Admission medications   Medication Sig Start Date End Date Taking? Authorizing Provider  benzonatate (TESSALON) 200 MG capsule Take 200 mg by mouth 3 (three) times daily as needed. 01/08/20  Yes [provider]  desogestrel-ethinyl estradiol (KARIVA)  0.15-0.02/0.01 MG (21/5) tablet Take 1 tablet by mouth daily.   Yes [provider]  levETIRAcetam (KEPPRA) 250 MG tablet Take 250 mg by mouth 2 (two) times daily.   Yes [provider]  lisinopril-hydrochlorothiazide (ZESTORETIC) 10-12.5 MG tablet Take 1 tablet by mouth daily. 10/21/19  Yes [provider]  metFORMIN (GLUCOPHAGE-XR) 500 MG 24 hr tablet Take 500 mg by mouth 2 (two) times daily. 01/04/20  Yes [provider]  Multiple Vitamin (MULTIVITAMIN) tablet Take 1 tablet by mouth daily.   Yes [provider]  zonisamide (ZONEGRAN) 100 MG capsule Take 200 mg by mouth 2 (two) times daily.   Yes [provider]     Critical care time: 45 minutes not including any separately billable procedures

## 2020-01-16 NOTE — Progress Notes (Signed)
Patient had mitts on when I left the room. Less than 5 minutes latter patient had removed mitts and pulled out ETT tube and Oral gastric tube. Dr. Gwenlyn Perking was on the floor and he called ED doctor for reintubation. Equipment retrieved and Dr. Danae Chen from ED arrived and placed ETT tube 7.5. OG tube was placed 16 french in mouth and waiting for x-ray verification. Order obtained for restraints, and placed. IV team was also called to see if they could place another peripheral line which she was able to place two. Called [facility] for transport to [facility] and waiting for transport.

## 2020-01-16 NOTE — Progress Notes (Signed)
Pharmacy Antibiotic Note  Jamie Knapp is a 44 y.o. female admitted on 01/13/2020 with HCAP.  Pharmacy has been consulted for Vancomycin and cefepime dosing. Worsening bilateral infiltrates. MD consulted with PCCM and recommended broadening antibiotics.  Plan: Vancomycin 2000mg  loading dose, then 1000mg  IV every 12 hours.  Goal trough 15-20 mcg/mL.  Cefepime 2gm IV q8h F/U cxs and clinical progress Monitor V/S, labs and levels as indicated  Height: 5\' 7"  (170.2 cm) Weight: 111.1 kg (245 lb) IBW/kg (Calculated) : 61.6  Temp (24hrs), Avg:98.5 F (36.9 C), Min:97.3 F (36.3 C), Max:99.1 F (37.3 C)  Recent Labs  Lab 01/26/2020 1820 01/03/2020 2206 01/10/20 0613 01/11/20 1028 01/11/20 1028 01/12/20 0619 01/13/20 0633 01/14/20 0802 01/16/20 0450 01/16/20 0752  WBC 5.0  --    < > 7.6  --  12.0* 9.0 11.9*  --  13.7*  CREATININE 0.74  --    < > 0.82   < > 0.69 0.79 0.62 0.91 0.90  LATICACIDVEN 1.2 1.5  --   --   --   --   --   --   --   --    < > = values in this interval not displayed.    Estimated Creatinine Clearance: 102.5 mL/min (by C-G formula based on SCr of 0.9 mg/dL).   Normalized CrCl 90mls/min  Allergies  Allergen Reactions  . Bee Venom Swelling    Antimicrobials this admission: Vancomycin 9/18>>  Cefepime 9/18 >>  Remdesivir 9/11>>9/15  Baracitinib 9/16>> Doxy 9/13 >>9/18 CTX 9/13 >> 9/18  Microbiology results: 9/11 BC x2:  ngtd   9/11 Resp PCR: SARS CoV-2 positive  9/13 C. Diff x3 neg 9/17 MRSA PCR is negative  Thank you for allowing pharmacy to be a part of this patient's care.  11/11, BS Pharm D, 10/13 Clinical Pharmacist Pager [phone] 01/16/2020 2:16 PM

## 2020-01-16 NOTE — Progress Notes (Signed)
eLink Physician-Brief Progress Note Patient Name: Jamie Knapp DOB: 01-Nov-1975 MRN: [id number]   Date of Service  01/16/2020  HPI/Events of Note  Hypoxia - Sat = 78% in setting of COVID pneumonia and pneumomediastinum.   eICU Interventions  Plan: 1. Portable CXR STAT.      Intervention Category Major Interventions: Hypoxemia - evaluation and management  Lenell Antu 01/16/2020, 10:35 PM

## 2020-01-16 NOTE — Progress Notes (Addendum)
Patient peak pressures ( vent) have been lower 30's Saturation falls when patient awakens 98 to 91  Breath sounds deceased for most part. 3000 positive at this time.

## 2020-01-17 ENCOUNTER — Inpatient Hospital Stay (HOSPITAL_COMMUNITY): Payer: BC Managed Care – PPO

## 2020-01-17 DIAGNOSIS — J9601 Acute respiratory failure with hypoxia: Secondary | ICD-10-CM

## 2020-01-17 DIAGNOSIS — R7303 Prediabetes: Secondary | ICD-10-CM

## 2020-01-17 LAB — COMPREHENSIVE METABOLIC PANEL
ALT: 99 U/L — ABNORMAL HIGH (ref 0–44)
AST: 37 U/L (ref 15–41)
Albumin: 2.2 g/dL — ABNORMAL LOW (ref 3.5–5.0)
Alkaline Phosphatase: 52 U/L (ref 38–126)
Anion gap: 8 (ref 5–15)
BUN: 23 mg/dL — ABNORMAL HIGH (ref 6–20)
CO2: 24 mmol/L (ref 22–32)
Calcium: 8.1 mg/dL — ABNORMAL LOW (ref 8.9–10.3)
Chloride: 109 mmol/L (ref 98–111)
Creatinine, Ser: 0.89 mg/dL (ref 0.44–1.00)
GFR calc Af Amer: >60 mL/min (ref >60)
GFR calc non Af Amer: >60 mL/min (ref >60)
Glucose, Bld: 228 mg/dL — ABNORMAL HIGH (ref 70–99)
Potassium: 4.5 mmol/L (ref 3.5–5.1)
Sodium: 141 mmol/L (ref 135–145)
Total Bilirubin: 0.7 mg/dL (ref 0.3–1.2)
Total Protein: 5.8 g/dL — ABNORMAL LOW (ref 6.5–8.1)

## 2020-01-17 LAB — TRIGLYCERIDES: Triglycerides: 272 mg/dL — ABNORMAL HIGH (ref <150)

## 2020-01-17 LAB — GLOBAL TEG PANEL
CFF Max Amplitude: 22.8 mm (ref 15–32)
CK with Heparinase (R): 4.5 min (ref 4.3–8.3)
Citrated Functional Fibrinogen: 416.1 mg/dL (ref 278–581)
Citrated Kaolin (K): 1.1 min (ref 0.8–2.1)
Citrated Kaolin (MA): 63.3 mm (ref 52–69)
Citrated Kaolin (R): 5 min (ref 4.6–9.1)
Citrated Kaolin Angle: 76.3 deg (ref 63–78)
Citrated Rapid TEG (MA): 65.1 mm (ref 52–70)

## 2020-01-17 LAB — POCT I-STAT 7, (LYTES, BLD GAS, ICA,H+H)
Acid-base deficit: 3 mmol/L — ABNORMAL HIGH (ref 0.0–2.0)
Bicarbonate: 23.9 mmol/L (ref 20.0–28.0)
Calcium, Ion: 1.18 mmol/L (ref 1.15–1.40)
HCT: 38 % (ref 36.0–46.0)
Hemoglobin: 12.9 g/dL (ref 12.0–15.0)
O2 Saturation: 93 %
Patient temperature: 98.7
Potassium: 4.4 mmol/L (ref 3.5–5.1)
Sodium: 141 mmol/L (ref 135–145)
TCO2: 25 mmol/L (ref 22–32)
pCO2 arterial: 47.9 mmHg (ref 32.0–48.0)
pH, Arterial: 7.306 — ABNORMAL LOW (ref 7.350–7.450)
pO2, Arterial: 76 mmHg — ABNORMAL LOW (ref 83.0–108.0)

## 2020-01-17 LAB — GLUCOSE, CAPILLARY
Glucose-Capillary: 170 mg/dL — ABNORMAL HIGH (ref 70–99)
Glucose-Capillary: 183 mg/dL — ABNORMAL HIGH (ref 70–99)
Glucose-Capillary: 212 mg/dL — ABNORMAL HIGH (ref 70–99)
Glucose-Capillary: 227 mg/dL — ABNORMAL HIGH (ref 70–99)
Glucose-Capillary: 244 mg/dL — ABNORMAL HIGH (ref 70–99)
Glucose-Capillary: 269 mg/dL — ABNORMAL HIGH (ref 70–99)

## 2020-01-17 LAB — MAGNESIUM: Magnesium: 2.8 mg/dL — ABNORMAL HIGH (ref 1.7–2.4)

## 2020-01-17 LAB — D-DIMER, QUANTITATIVE: D-Dimer, Quant: 5.4 ug/mL-FEU — ABNORMAL HIGH (ref 0.00–0.50)

## 2020-01-17 LAB — PHOSPHORUS: Phosphorus: 3.4 mg/dL (ref 2.5–4.6)

## 2020-01-17 MED ORDER — SODIUM CHLORIDE 0.9% FLUSH: 10.0000 mL | INTRAVENOUS | Status: DC | PRN

## 2020-01-17 MED ORDER — LEVETIRACETAM 100 MG/ML PO SOLN
250.0000 mg | Freq: Two times a day (BID) | ORAL | Status: DC
  Administered 2020-01-17 – 2020-01-27 (×22): 250 mg
  Filled 2020-01-17 (×22): qty 5

## 2020-01-17 MED ORDER — INSULIN ASPART 100 UNIT/ML ~~LOC~~ SOLN
5.0000 [IU] | SUBCUTANEOUS | Status: DC
  Administered 2020-01-17 – 2020-01-18 (×6): 5 [IU] via SUBCUTANEOUS

## 2020-01-17 MED ORDER — ZONISAMIDE 100 MG PO CAPS
200.0000 mg | ORAL_CAPSULE | Freq: Two times a day (BID) | ORAL | Status: DC
  Administered 2020-01-17 – 2020-01-27 (×22): 200 mg via ORAL
  Filled 2020-01-17 (×22): qty 2

## 2020-01-17 MED ORDER — ENOXAPARIN SODIUM 60 MG/0.6ML ~~LOC~~ SOLN
0.5000 mg/kg | SUBCUTANEOUS | Status: DC
  Administered 2020-01-17: 52.5 mg via SUBCUTANEOUS
  Filled 2020-01-17: qty 0.6

## 2020-01-17 MED ORDER — INSULIN DETEMIR 100 UNIT/ML ~~LOC~~ SOLN
15.0000 [IU] | Freq: Two times a day (BID) | SUBCUTANEOUS | Status: DC
  Administered 2020-01-17 – 2020-01-26 (×18): 15 [IU] via SUBCUTANEOUS
  Filled 2020-01-17 (×23): qty 0.15

## 2020-01-17 MED ORDER — SODIUM CHLORIDE 0.9 % IV SOLN
2.0000 g | Freq: Three times a day (TID) | INTRAVENOUS | Status: DC
  Administered 2020-01-17 – 2020-01-18 (×3): 2 g via INTRAVENOUS
  Filled 2020-01-17 (×3): qty 2

## 2020-01-17 MED ORDER — SODIUM CHLORIDE 0.9% FLUSH
10.0000 mL | Freq: Two times a day (BID) | INTRAVENOUS | Status: DC
  Administered 2020-01-17: 20 mL
  Administered 2020-01-17 – 2020-01-24 (×9): 10 mL
  Administered 2020-01-24: 20 mL
  Administered 2020-01-25 – 2020-01-27 (×6): 10 mL

## 2020-01-17 MED ORDER — LINAGLIPTIN 5 MG PO TABS
5.0000 mg | ORAL_TABLET | Freq: Every day | ORAL | Status: DC
  Filled 2020-01-17: qty 1

## 2020-01-17 MED ORDER — LINAGLIPTIN 5 MG PO TABS
5.0000 mg | ORAL_TABLET | Freq: Every day | ORAL | Status: DC
  Administered 2020-01-18 – 2020-01-27 (×10): 5 mg
  Filled 2020-01-17 (×10): qty 1

## 2020-01-17 NOTE — Progress Notes (Signed)
Patient placed in prone position.  ETT secured with cloth tape at 24 cm at the lip.  Patient tolerated well with no complications.  

## 2020-01-17 NOTE — Progress Notes (Signed)
NAME:  Jamie Knapp, MRN:  [id number], DOB:  1975/08/24, LOS: 8 ADMISSION DATE:  01/31/20, CONSULTATION DATE:  9/18 REFERRING MD:  Gwenlyn Perking, CHIEF COMPLAINT:  Dyspnea   Brief History   44 year old woman with hx of HTN and seizures presenting with COVID ARDS.  Past Medical History  HTN Seizure dx  Significant Hospital Events   9/11 [facility] ER 9/17 Intubated  Consults:    Procedures:  9/17 ETT>  9/18 L IJ CVL >  9/18 L Radial arterial line >   Significant Diagnostic Tests:    Micro Data:  9/5 SARS COV 2 > positive 9/11 blood > neg 9/12 c diff > neg 9/18 resp culture >   Antimicrobials:  Doxycycline 9/13>>9/17 Ceftriaxone 9/13>>9/17 Cefepime  9/18>> Barcitinib 9/16>>   Interim history/subjective:  Quiet night Synchronous No bowel movement UOP OK Tolerating tube feeding Supine overnight Glucose elevated Plateau 32    Objective   Blood pressure 96/70, pulse 74, temperature 97.8 F (36.6 C), temperature source Oral, resp. rate (!) 35, height 5\' 7"  (1.702 m), weight 106.4 kg, SpO2 96 %.    Vent Mode: PRVC FiO2 (%):  [70 %-100 %] 100 % Set Rate:  [35 bmp] 35 bmp Vt Set:  [360 mL-500 mL] 400 mL PEEP:  [14 cmH20] 14 cmH20 Plateau Pressure:  [26 cmH20-34 cmH20] 29 cmH20   Intake/Output Summary (Last 24 hours) at 01/17/2020 0733 Last data filed at 01/17/2020 0700 Gross per 24 hour  Intake 2430.23 ml  Output 400 ml  Net 2030.23 ml   Filed Weights   01-31-2020 1731 01/17/20 0442  Weight: 111.1 kg 106.4 kg    Examination: General:  In bed on vent HENT: NCAT ETT in place PULM: Crackles bases B, vent supported breathing CV: RRR, no mgr GI: BS+, soft, nontender MSK: normal bulk and tone Neuro: sedated on vent  9/19 CXR reviewed> bibasilar air space disease, ett in place  Resolved Hospital Problem list     Assessment & Plan:  Acute hypoxemic respiratory failure with COVID ARDS HCAP? Continue mechanical ventilation per ARDS protocol Target TVol  6-8cc/kgIBW Target Plateau Pressure < 30cm H20 Target driving pressure less than 15 cm of water Target PaO2 55-65: titrate PEEP/FiO2 per protocol As long as PaO2 to FiO2 ratio is less than 1:150 position in prone position for 16 hours a day Check CVP daily if CVL in place Target CVP less than 4, diurese as necessary Ventilator associated pneumonia prevention protocol 9/19 prone, continue baricitinib, solumedrol, lasix, check CVP, continue cefepime 5 days  Pneumomediastinum Monitor CXR  Need for sedation with mechanical ventilation PAD protocol RASS -3 Fentanyl and propofol infusion   Hypertension Monitor  History of seizures Keppra  DM2 with hyperglycemia ssi linagliptin detemir Add tube feeding coverage  Best practice:  Diet: tube feeding Pain/Anxiety/Delirium protocol (if indicated): as above VAP protocol (if indicated): yes DVT prophylaxis: lovenox  GI prophylaxis: Pantoprazole for stress ulcer prophylaxis Glucose control: as above Mobility: bed rest Code Status: full Family Communication: I called her mother on 9/19 and left a detailed message.   Disposition: remain in ICU  Labs   CBC: Recent Labs  Lab 01/11/20 1028 01/11/20 1028 01/12/20 0619 01/12/20 0619 01/13/20 01/15/20 01/14/20 0802 01/16/20 0752 01/16/20 1851 01/17/20 0259  WBC 7.6  --  12.0*  --  9.0 11.9* 13.7*  --   --   NEUTROABS 6.4  --  10.6*  --  8.0* 10.3*  --   --   --  HGB 14.8   < > 15.3*   < > 13.9 14.0 12.8 12.6 12.9  HCT 45.6   < > 47.8*   < > 43.6 43.1 40.4 37.0 38.0  MCV 89.1  --  91.2  --  89.9 88.5 91.4  --   --   PLT 155  --  167  --  164 183 162  --   --    < > = values in this interval not displayed.    Basic Metabolic Panel: Recent Labs  Lab 01/11/20 1028 01/11/20 1028 01/12/20 0619 01/12/20 6045 01/13/20 4098 01/13/20 1191 01/14/20 0802 01/16/20 0450 01/16/20 0752 01/16/20 1851 01/16/20 2142 01/17/20 0259 01/17/20 0500  NA 133*   < > 136   < > 137   < > 136   --  140 141  --  141 141  K 4.3   < > 4.1   < > 3.8   < > 3.7  --  4.3 4.3  --  4.4 4.5  CL 100   < > 105  --  103  --  102  --  109  --   --   --  109  CO2 18*   < > 21*  --  24  --  24  --  23  --   --   --  24  GLUCOSE 241*   < > 176*  --  196*  --  202*  --  201*  --   --   --  228*  BUN 15   < > 15  --  19  --  18  --  20  --   --   --  23*  CREATININE 0.82   < > 0.69   < > 0.79  --  0.62 0.91 0.90  --   --   --  0.89  CALCIUM 7.9*   < > 7.9*  --  7.9*  --  8.0*  --  7.7*  --   --   --  8.1*  MG 2.3  --   --   --   --   --   --   --   --   --  2.7*  --  2.8*  PHOS  --   --   --   --   --   --   --   --   --   --  3.1  --  3.4   < > = values in this interval not displayed.   GFR: Estimated Creatinine Clearance: 101.2 mL/min (by C-G formula based on SCr of 0.89 mg/dL). Recent Labs  Lab 01/12/20 0619 01/13/20 0633 01/14/20 0802 01/16/20 0752 01/16/20 2142  PROCALCITON  --   --   --   --  0.15  WBC 12.0* 9.0 11.9* 13.7*  --     Liver Function Tests: Recent Labs  Lab 01/12/20 0619 01/13/20 4782 01/14/20 0802 01/16/20 0752 01/17/20 0500  AST 69* 53* 45* 91* 37  ALT 36 32 29 138* 99*  ALKPHOS 40 43 49 56 52  BILITOT 0.5 0.7 0.8 0.8 0.7  PROT 6.1* 6.1* 6.1* 5.8* 5.8*  ALBUMIN 2.8* 2.6* 2.5* 2.4* 2.2*   No results for input(s): LIPASE, AMYLASE in the last 168 hours. No results for input(s): AMMONIA in the last 168 hours.  ABG    Component Value Date/Time   PHART 7.306 (L) 01/17/2020 0259   PCO2ART 47.9 01/17/2020 0259  PO2ART 76 (L) 01/17/2020 0259   HCO3 23.9 01/17/2020 0259   TCO2 25 01/17/2020 0259   ACIDBASEDEF 3.0 (H) 01/17/2020 0259   O2SAT 93.0 01/17/2020 0259     Coagulation Profile: No results for input(s): INR, PROTIME in the last 168 hours.  Cardiac Enzymes: No results for input(s): CKTOTAL, CKMB, CKMBINDEX, TROPONINI in the last 168 hours.  HbA1C: Hgb A1c MFr Bld  Date/Time Value Ref Range Status  01/23/2020 10:06 PM 6.2 (H) 4.8 - 5.6 % Final      Comment:    (NOTE) Pre diabetes:          5.7%-6.4%  Diabetes:              >6.4%  Glycemic control for   <7.0% adults with diabetes     CBG: Recent Labs  Lab 01/16/20 1202 01/16/20 1653 01/16/20 2003 01/16/20 2331 01/17/20 0355  GLUCAP 196* 152* 163* 209* 212*     Critical care time: 35 minutes    Heber [place], MD [facility] Pager: [phone] Cell: [phone] If no response, call [phone]

## 2020-01-18 ENCOUNTER — Inpatient Hospital Stay (HOSPITAL_COMMUNITY): Payer: BC Managed Care – PPO

## 2020-01-18 LAB — POCT I-STAT 7, (LYTES, BLD GAS, ICA,H+H)
Acid-base deficit: 1 mmol/L (ref 0.0–2.0)
Bicarbonate: 26.9 mmol/L (ref 20.0–28.0)
Calcium, Ion: 1.28 mmol/L (ref 1.15–1.40)
HCT: 36 % (ref 36.0–46.0)
Hemoglobin: 12.2 g/dL (ref 12.0–15.0)
O2 Saturation: 93 %
Potassium: 4.8 mmol/L (ref 3.5–5.1)
Sodium: 142 mmol/L (ref 135–145)
TCO2: 29 mmol/L (ref 22–32)
pCO2 arterial: 56 mmHg — ABNORMAL HIGH (ref 32.0–48.0)
pH, Arterial: 7.289 — ABNORMAL LOW (ref 7.350–7.450)
pO2, Arterial: 75 mmHg — ABNORMAL LOW (ref 83.0–108.0)

## 2020-01-18 LAB — COMPREHENSIVE METABOLIC PANEL
ALT: 76 U/L — ABNORMAL HIGH (ref 0–44)
AST: 26 U/L (ref 15–41)
Albumin: 2.3 g/dL — ABNORMAL LOW (ref 3.5–5.0)
Alkaline Phosphatase: 52 U/L (ref 38–126)
Anion gap: 8 (ref 5–15)
BUN: 28 mg/dL — ABNORMAL HIGH (ref 6–20)
CO2: 25 mmol/L (ref 22–32)
Calcium: 8.9 mg/dL (ref 8.9–10.3)
Chloride: 109 mmol/L (ref 98–111)
Creatinine, Ser: 0.77 mg/dL (ref 0.44–1.00)
GFR calc Af Amer: >60 mL/min (ref >60)
GFR calc non Af Amer: >60 mL/min (ref >60)
Glucose, Bld: 165 mg/dL — ABNORMAL HIGH (ref 70–99)
Potassium: 4.4 mmol/L (ref 3.5–5.1)
Sodium: 142 mmol/L (ref 135–145)
Total Bilirubin: 0.5 mg/dL (ref 0.3–1.2)
Total Protein: 6 g/dL — ABNORMAL LOW (ref 6.5–8.1)

## 2020-01-18 LAB — CBC WITH DIFFERENTIAL/PLATELET
Abs Immature Granulocytes: 0.85 10*3/uL — ABNORMAL HIGH (ref 0.00–0.07)
Basophils Absolute: 0.1 10*3/uL (ref 0.0–0.1)
Basophils Relative: 0 %
Eosinophils Absolute: 0.1 10*3/uL (ref 0.0–0.5)
Eosinophils Relative: 0 %
HCT: 41.6 % (ref 36.0–46.0)
Hemoglobin: 12.8 g/dL (ref 12.0–15.0)
Immature Granulocytes: 5 %
Lymphocytes Relative: 4 %
Lymphs Abs: 0.8 10*3/uL (ref 0.7–4.0)
MCH: 29.5 pg (ref 26.0–34.0)
MCHC: 30.8 g/dL (ref 30.0–36.0)
MCV: 95.9 fL (ref 80.0–100.0)
Monocytes Absolute: 0.3 10*3/uL (ref 0.1–1.0)
Monocytes Relative: 2 %
Neutro Abs: 15.4 10*3/uL — ABNORMAL HIGH (ref 1.7–7.7)
Neutrophils Relative %: 89 %
Platelets: 191 10*3/uL (ref 150–400)
RBC: 4.34 MIL/uL (ref 3.87–5.11)
RDW: 13.3 % (ref 11.5–15.5)
WBC: 17.4 10*3/uL — ABNORMAL HIGH (ref 4.0–10.5)
nRBC: 0 % (ref 0.0–0.2)

## 2020-01-18 LAB — PHOSPHORUS: Phosphorus: 5.2 mg/dL — ABNORMAL HIGH (ref 2.5–4.6)

## 2020-01-18 LAB — GLUCOSE, CAPILLARY
Glucose-Capillary: 128 mg/dL — ABNORMAL HIGH (ref 70–99)
Glucose-Capillary: 156 mg/dL — ABNORMAL HIGH (ref 70–99)
Glucose-Capillary: 158 mg/dL — ABNORMAL HIGH (ref 70–99)
Glucose-Capillary: 172 mg/dL — ABNORMAL HIGH (ref 70–99)
Glucose-Capillary: 173 mg/dL — ABNORMAL HIGH (ref 70–99)
Glucose-Capillary: 186 mg/dL — ABNORMAL HIGH (ref 70–99)

## 2020-01-18 LAB — TRIGLYCERIDES: Triglycerides: 340 mg/dL — ABNORMAL HIGH (ref <150)

## 2020-01-18 LAB — MAGNESIUM: Magnesium: 2.6 mg/dL — ABNORMAL HIGH (ref 1.7–2.4)

## 2020-01-18 LAB — D-DIMER, QUANTITATIVE: D-Dimer, Quant: 2.88 ug/mL-FEU — ABNORMAL HIGH (ref 0.00–0.50)

## 2020-01-18 MED ORDER — MIDAZOLAM 50MG/50ML (1MG/ML) PREMIX INFUSION
0.5000 mg/h | INTRAVENOUS | Status: DC
  Administered 2020-01-18: 4 mg/h via INTRAVENOUS
  Administered 2020-01-18: 1 mg/h via INTRAVENOUS
  Administered 2020-01-19: 10 mg/h via INTRAVENOUS
  Administered 2020-01-19: 6 mg/h via INTRAVENOUS
  Administered 2020-01-19 – 2020-01-22 (×12): 8 mg/h via INTRAVENOUS
  Administered 2020-01-23: 2 mg/h via INTRAVENOUS
  Administered 2020-01-23: 10 mg/h via INTRAVENOUS
  Administered 2020-01-23: 8 mg/h via INTRAVENOUS
  Filled 2020-01-18 (×7): qty 50
  Filled 2020-01-18: qty 100
  Filled 2020-01-18 (×12): qty 50

## 2020-01-18 MED ORDER — VECURONIUM BROMIDE 10 MG IV SOLR
10.0000 mg | Freq: Four times a day (QID) | INTRAVENOUS | Status: DC | PRN
  Administered 2020-01-18 – 2020-01-23 (×4): 10 mg via INTRAVENOUS
  Filled 2020-01-18 (×4): qty 10

## 2020-01-18 MED ORDER — ENOXAPARIN SODIUM 60 MG/0.6ML ~~LOC~~ SOLN
55.0000 mg | SUBCUTANEOUS | Status: DC
  Administered 2020-01-18 – 2020-01-27 (×10): 55 mg via SUBCUTANEOUS
  Filled 2020-01-18 (×10): qty 0.6

## 2020-01-18 MED ORDER — INSULIN ASPART 100 UNIT/ML ~~LOC~~ SOLN
5.0000 [IU] | SUBCUTANEOUS | Status: DC
  Administered 2020-01-18 – 2020-01-27 (×45): 5 [IU] via SUBCUTANEOUS

## 2020-01-18 MED ORDER — MIDAZOLAM BOLUS VIA INFUSION
1.0000 mg | INTRAVENOUS | Status: DC | PRN
  Administered 2020-01-21 – 2020-01-23 (×2): 2 mg via INTRAVENOUS
  Filled 2020-01-18: qty 2

## 2020-01-18 MED ORDER — VITAL HIGH PROTEIN PO LIQD
1000.0000 mL | ORAL | Status: DC
  Administered 2020-01-18 – 2020-01-25 (×10): 1000 mL

## 2020-01-18 NOTE — Social Work (Signed)
CSW received a call from pt mother Jamie Knapp at [phone] inquiring about begin able to pick up pt purse to pay bills. Passed this message on to bedside RN Sheralyn Boatman due to isolation precautions. Sheralyn Boatman sent this Clinical research associate a message saying she would give pt mother a call.   Octavio Graves, MSW, LCSW [facility] Clinical Social Work

## 2020-01-18 NOTE — Progress Notes (Signed)
Pt proned at this tinme with 3x RN and 2x RT without complication

## 2020-01-18 NOTE — Progress Notes (Signed)
Initial Nutrition Assessment  DOCUMENTATION CODES:   Not applicable  INTERVENTION:   Tube Feeding via OG: Vital High Protein at 65 ml/hr Provides 1560 kcals, 137 g of protein and 1310 mL of free water  TF regimen and propofol at current rate providing 2439 total kcal/day    NUTRITION DIAGNOSIS:   Inadequate oral intake related to acute illness as evidenced by NPO status.  GOAL:   Patient will meet greater than or equal to 90% of their needs  MONITOR:   Vent status, Labs, Weight trends, TF tolerance  REASON FOR ASSESSMENT:   Consult, Ventilator Enteral/tube feeding initiation and management  ASSESSMENT:   44 yo female admitted with COVID+ ARDS requiring intubation on 9/28. PMH includes HTN, seizures  9/11 Admitted 9/17 Intubated 9/18 Adult TF protocol initiated  Pt remains on vent support; pt requiring prone positioning. Pt last placed in prone position 1610 yesterday; supined at 1200.  Propofol: 33.3 ml/hr  Started on Adult TF Protocol on 9/18; tolerating Vital High Protein at 40 ml/hr via OG tube  Current wt 105.5 kg; weight yesterday 106.4 kg; admit weight 111.1 kg (unsure of accuracy-?stated/estimated). Net +6 L per I/O flow sheet  Labs: Creatinine wdl, BUN 28, CBGs 170-269 (ICU goal 140-180) Meds: Vit C, ss novolog, novlog q 4 hours, levemir, tradjenta, solumedrol, miralax, zinc sulfate    Diet Order:   Diet Order            Diet NPO time specified  Diet effective now                 EDUCATION NEEDS:   Not appropriate for education at this time  Skin:  Skin Assessment: Reviewed RN Assessment  Last BM:  9/18  Height:   Ht Readings from Last 1 Encounters:  01/16/20 5\' 7"  (1.702 m)    Weight:   Wt Readings from Last 1 Encounters:  01/18/20 105.5 kg    BMI:  Body mass index is 36.43 kg/m.  Estimated Nutritional Needs:   Kcal:  2110-2530 kcals  Protein:  125-145 g  Fluid:  >/= 2 L   10-07-1976 MS, RDN, LDN,  CNSC Registered Dietitian III Clinical Nutrition RD Pager and On-Call Pager Number Located in [place]

## 2020-01-18 NOTE — Progress Notes (Addendum)
Pt supined at this time with no complications. Small blister noted to bottom center lip as well as large abrasion/blister to septum of nose. Mepilex added for protection. RN made aware. Ett secured with commercial tube holder in proper position. ABG to be obtained in approximately 1 hr to assess need for re-prone 

## 2020-01-18 NOTE — Progress Notes (Signed)
Patients head turned to the Right without complications. Suction catheter passed without resistance.

## 2020-01-18 NOTE — Progress Notes (Signed)
NAME:  Jamie Knapp, MRN:  [id number], DOB:  Apr 11, 1976, LOS: 9 ADMISSION DATE:  01/16/2020, CONSULTATION DATE:  9/18 REFERRING MD:  Gwenlyn Perking, CHIEF COMPLAINT:  Dyspnea   Brief History   44 year old woman with hx of HTN and seizures presenting with COVID ARDS.  Past Medical History  HTN Seizure dx  Significant Hospital Events   9/11 AP ER 9/17 Intubated  Consults:    Procedures:  9/17 ETT>  9/18 L IJ CVL >  9/18 L Radial arterial line >   Significant Diagnostic Tests:    Micro Data:  9/5 SARS COV 2 > positive 9/11 blood > neg 9/12 c diff > neg 9/18 resp culture > negative  Antimicrobials:  Doxycycline 9/13>>9/17 Ceftriaxone 9/13>>9/17 Cefepime  9/18>> Barcitinib 9/16>>   Interim history/subjective:  Required to be placed in prone position as P/F ratio was <150, intermittently requiring neuromuscular blockade   Objective   Blood pressure 139/74, pulse 90, temperature 98.6 F (37 C), temperature source Axillary, resp. rate (!) 31, height 5\' 7"  (1.702 m), weight 105.5 kg, SpO2 95 %. CVP:  [9 mmHg-15 mmHg] 10 mmHg  Vent Mode: PRVC FiO2 (%):  [70 %-100 %] 70 % Set Rate:  [35 bmp] 35 bmp Vt Set:  [370 mL-450 mL] 370 mL PEEP:  [14 cmH20-16 cmH20] 16 cmH20 Plateau Pressure:  [25 cmH20-32 cmH20] 30 cmH20   Intake/Output Summary (Last 24 hours) at 01/18/2020 1125 Last data filed at 01/18/2020 1014 Gross per 24 hour  Intake 2243.68 ml  Output 1310 ml  Net 933.68 ml   Filed Weights   01/27/2020 1731 01/17/20 0442 01/18/20 0500  Weight: 111.1 kg 106.4 kg 105.5 kg    Examination:   Physical exam: General: Acutely ill-appearing morbidly obese female, orally intubated HEAENT: [place]/AT, eyes anicteric.  ETT and OGT in place Neuro: Sedated, not following commands.  Eyes are closed.  Pupils 3 mm bilateral reactive to light Chest: Coarse breath sounds, no wheezes or rhonchi Heart: Regular rate and rhythm, no murmurs or gallops Abdomen: Soft, nontender,  nondistended, bowel sounds present Skin: No rash   Resolved Hospital Problem list    Pneumomediastinum  Assessment & Plan:  Acute hypoxemic respiratory failure due to COVID ARDS  Continue mechanical ventilation per ARDS protocol Target TVol 6 cc/kgIBW Target Plateau Pressure < 30cm H20 and driving pressure < 15 cm of water has been achieved Target PaO2 55-80: titrate PEEP/FiO2 per protocol Patient will be placed in supine position today, will repeat ABG afterwards and if PaO2 to FiO2 ratio is<150, consider prone position for 16 hours a day Ventilator associated pneumonia prevention protocol Continue baricitinib and steroid RASS goal -4/-5, continue Fentanyl and propofol infusion   Hypertension Monitor  History of seizures Continue Keppra  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 On linagliptin, detemir and tube feeding coverage insulin  Best practice:  Diet: tube feeding Pain/Anxiety/Delirium protocol (if indicated): as above VAP protocol (if indicated): yes DVT prophylaxis: lovenox  GI prophylaxis: Pantoprazole for stress ulcer prophylaxis Glucose control: as above Mobility: bed rest Code Status: full Family Communication: Will update family today Disposition: remain in ICU  Labs   CBC: Recent Labs  Lab 01/12/20 0619 01/12/20 0619 01/13/20 01/15/20 01/13/20 0633 01/14/20 0802 01/16/20 0752 01/16/20 1851 01/17/20 0259 01/18/20 0359  WBC 12.0*  --  9.0  --  11.9* 13.7*  --   --  17.4*  NEUTROABS 10.6*  --  8.0*  --  10.3*  --   --   --  15.4*  HGB 15.3*   < > 13.9   < > 14.0 12.8 12.6 12.9 12.8  HCT 47.8*   < > 43.6   < > 43.1 40.4 37.0 38.0 41.6  MCV 91.2  --  89.9  --  88.5 91.4  --   --  95.9  PLT 167  --  164  --  183 162  --   --  191   < > = values in this interval not displayed.    Basic Metabolic Panel: Recent Labs  Lab 01/13/20 0633 01/13/20 3295 01/14/20 0802 01/14/20 0802 01/16/20 0450 01/16/20 0752 01/16/20 1851 01/16/20 2142 01/17/20 0259  01/17/20 0500 01/18/20 0359  NA 137   < > 136   < >  --  140 141  --  141 141 142  K 3.8   < > 3.7   < >  --  4.3 4.3  --  4.4 4.5 4.4  CL 103  --  102  --   --  109  --   --   --  109 109  CO2 24  --  24  --   --  23  --   --   --  24 25  GLUCOSE 196*  --  202*  --   --  201*  --   --   --  228* 165*  BUN 19  --  18  --   --  20  --   --   --  23* 28*  CREATININE 0.79   < > 0.62  --  0.91 0.90  --   --   --  0.89 0.77  CALCIUM 7.9*  --  8.0*  --   --  7.7*  --   --   --  8.1* 8.9  MG  --   --   --   --   --   --   --  2.7*  --  2.8* 2.6*  PHOS  --   --   --   --   --   --   --  3.1  --  3.4 5.2*   < > = values in this interval not displayed.   GFR: Estimated Creatinine Clearance: 112.2 mL/min (by C-G formula based on SCr of 0.77 mg/dL). Recent Labs  Lab 01/13/20 0633 01/14/20 0802 01/16/20 0752 01/16/20 2142 01/18/20 0359  PROCALCITON  --   --   --  0.15  --   WBC 9.0 11.9* 13.7*  --  17.4*    Liver Function Tests: Recent Labs  Lab 01/13/20 1884 01/14/20 0802 01/16/20 0752 01/17/20 0500 01/18/20 0359  AST 53* 45* 91* 37 26  ALT 32 29 138* 99* 76*  ALKPHOS 43 49 56 52 52  BILITOT 0.7 0.8 0.8 0.7 0.5  PROT 6.1* 6.1* 5.8* 5.8* 6.0*  ALBUMIN 2.6* 2.5* 2.4* 2.2* 2.3*   No results for input(s): LIPASE, AMYLASE in the last 168 hours. No results for input(s): AMMONIA in the last 168 hours.  ABG    Component Value Date/Time   PHART 7.306 (L) 01/17/2020 0259   PCO2ART 47.9 01/17/2020 0259   PO2ART 76 (L) 01/17/2020 0259   HCO3 23.9 01/17/2020 0259   TCO2 25 01/17/2020 0259   ACIDBASEDEF 3.0 (H) 01/17/2020 0259   O2SAT 93.0 01/17/2020 0259     Coagulation Profile: No results for input(s): INR, PROTIME in the last 168 hours.  Cardiac Enzymes: No results for input(s): CKTOTAL, CKMB,  CKMBINDEX, TROPONINI in the last 168 hours.  HbA1C: Hgb A1c MFr Bld  Date/Time Value Ref Range Status  12/30/2019 10:06 PM 6.2 (H) 4.8 - 5.6 % Final    Comment:    (NOTE) Pre  diabetes:          5.7%-6.4%  Diabetes:              >6.4%  Glycemic control for   <7.0% adults with diabetes     CBG: Recent Labs  Lab 01/17/20 1621 01/17/20 1941 01/17/20 2334 01/18/20 0351 01/18/20 0816  GLUCAP 244* 183* 170* 158* 186*    Total critical care time: 41 minutes  Performed by: Cheri Fowler   Critical care time was exclusive of separately billable procedures and treating other patients.   Critical care was necessary to treat or prevent imminent or life-threatening deterioration.   Critical care was time spent personally by me on the following activities: development of treatment plan with patient and/or surrogate as well as nursing, discussions with consultants, evaluation of patient's response to treatment, examination of patient, obtaining history from patient or surrogate, ordering and performing treatments and interventions, ordering and review of laboratory studies, ordering and review of radiographic studies, pulse oximetry and re-evaluation of patient's condition.   Cheri Fowler MD Critical care physician [facility]  Pager: [phone] Mobile: [phone]

## 2020-01-19 DIAGNOSIS — L8990 Pressure ulcer of unspecified site, unspecified stage: Secondary | ICD-10-CM | POA: Insufficient documentation

## 2020-01-19 LAB — POCT I-STAT 7, (LYTES, BLD GAS, ICA,H+H)
Acid-Base Excess: 7 mmol/L — ABNORMAL HIGH (ref 0.0–2.0)
Bicarbonate: 34.2 mmol/L — ABNORMAL HIGH (ref 20.0–28.0)
Calcium, Ion: 1.23 mmol/L (ref 1.15–1.40)
HCT: 35 % — ABNORMAL LOW (ref 36.0–46.0)
Hemoglobin: 11.9 g/dL — ABNORMAL LOW (ref 12.0–15.0)
O2 Saturation: 95 %
Potassium: 4.1 mmol/L (ref 3.5–5.1)
Sodium: 144 mmol/L (ref 135–145)
TCO2: 36 mmol/L — ABNORMAL HIGH (ref 22–32)
pCO2 arterial: 60.6 mmHg — ABNORMAL HIGH (ref 32.0–48.0)
pH, Arterial: 7.359 (ref 7.350–7.450)
pO2, Arterial: 80 mmHg — ABNORMAL LOW (ref 83.0–108.0)

## 2020-01-19 LAB — CBC
HCT: 37.8 % (ref 36.0–46.0)
Hemoglobin: 11.4 g/dL — ABNORMAL LOW (ref 12.0–15.0)
MCH: 28.7 pg (ref 26.0–34.0)
MCHC: 30.2 g/dL (ref 30.0–36.0)
MCV: 95.2 fL (ref 80.0–100.0)
Platelets: 174 10*3/uL (ref 150–400)
RBC: 3.97 MIL/uL (ref 3.87–5.11)
RDW: 13.2 % (ref 11.5–15.5)
WBC: 15.7 10*3/uL — ABNORMAL HIGH (ref 4.0–10.5)
nRBC: 0 % (ref 0.0–0.2)

## 2020-01-19 LAB — BASIC METABOLIC PANEL
Anion gap: 8 (ref 5–15)
BUN: 27 mg/dL — ABNORMAL HIGH (ref 6–20)
CO2: 28 mmol/L (ref 22–32)
Calcium: 8.7 mg/dL — ABNORMAL LOW (ref 8.9–10.3)
Chloride: 109 mmol/L (ref 98–111)
Creatinine, Ser: 0.62 mg/dL (ref 0.44–1.00)
GFR calc Af Amer: >60 mL/min (ref >60)
GFR calc non Af Amer: >60 mL/min (ref >60)
Glucose, Bld: 177 mg/dL — ABNORMAL HIGH (ref 70–99)
Potassium: 4.8 mmol/L (ref 3.5–5.1)
Sodium: 145 mmol/L (ref 135–145)

## 2020-01-19 LAB — GLUCOSE, CAPILLARY
Glucose-Capillary: 131 mg/dL — ABNORMAL HIGH (ref 70–99)
Glucose-Capillary: 144 mg/dL — ABNORMAL HIGH (ref 70–99)
Glucose-Capillary: 171 mg/dL — ABNORMAL HIGH (ref 70–99)
Glucose-Capillary: 178 mg/dL — ABNORMAL HIGH (ref 70–99)
Glucose-Capillary: 194 mg/dL — ABNORMAL HIGH (ref 70–99)
Glucose-Capillary: 195 mg/dL — ABNORMAL HIGH (ref 70–99)

## 2020-01-19 LAB — TRIGLYCERIDES: Triglycerides: 186 mg/dL — ABNORMAL HIGH (ref <150)

## 2020-01-19 LAB — MAGNESIUM: Magnesium: 2.3 mg/dL (ref 1.7–2.4)

## 2020-01-19 LAB — PHOSPHORUS: Phosphorus: 4 mg/dL (ref 2.5–4.6)

## 2020-01-19 MED ORDER — METHYLPREDNISOLONE SODIUM SUCC 125 MG IJ SOLR
60.0000 mg | Freq: Two times a day (BID) | INTRAMUSCULAR | Status: DC
  Administered 2020-01-19 – 2020-01-20 (×2): 60 mg via INTRAVENOUS
  Filled 2020-01-19 (×2): qty 2

## 2020-01-19 MED ORDER — FUROSEMIDE 10 MG/ML IJ SOLN
60.0000 mg | Freq: Once | INTRAMUSCULAR | Status: AC
  Administered 2020-01-19: 60 mg via INTRAVENOUS
  Filled 2020-01-19: qty 6

## 2020-01-19 MED ORDER — LACTULOSE 10 GM/15ML PO SOLN: 20.0000 g | Freq: Once | ORAL | Status: DC

## 2020-01-19 MED FILL — Medication: Qty: 1 | Status: AC

## 2020-01-19 NOTE — Progress Notes (Addendum)
NAME:  Jamie Knapp, MRN:  [id number], DOB:  1975/05/10, LOS: 10 ADMISSION DATE:  12/30/2019, CONSULTATION DATE:  9/18 REFERRING MD:  Gwenlyn Perking, CHIEF COMPLAINT:  Dyspnea   Brief History   44 year old woman with hx of HTN and seizures presenting with COVID ARDS.  Past Medical History  HTN Seizure dx  Significant Hospital Events   9/11 AP ER 9/17 Intubated  Consults:    Procedures:  9/17 ETT>  9/18 L IJ CVL >  9/18 L Radial arterial line >   Significant Diagnostic Tests:    Micro Data:  9/5 SARS COV 2 > positive 9/11 blood > neg 9/12 c diff > neg 9/18 resp culture > negative  Antimicrobials:  Doxycycline 9/13>>9/17 Ceftriaxone 9/13>>9/17 Cefepime  9/18>> Barcitinib 9/16>>   Interim history/subjective:  Patient was proned again last night because of P/F ratio was less than 150.  MRSA PCR came back negative, vancomycin was stopped   Objective   Blood pressure 131/76, pulse 92, temperature 99.1 F (37.3 C), temperature source Oral, resp. rate (!) 33, height 5\' 7"  (1.702 m), weight 105.5 kg, SpO2 96 %. CVP:  [4 mmHg-10 mmHg] 7 mmHg  Vent Mode: PRVC FiO2 (%):  [70 %-80 %] 70 % Set Rate:  [33 bmp] 33 bmp Vt Set:  [370 mL] 370 mL PEEP:  [16 cmH20] 16 cmH20 Plateau Pressure:  [29 cmH20-31 cmH20] 31 cmH20   Intake/Output Summary (Last 24 hours) at 01/19/2020 1106 Last data filed at 01/19/2020 1000 Gross per 24 hour  Intake 1299.32 ml  Output 1720 ml  Net -420.68 ml   Filed Weights   01/08/2020 1731 01/17/20 0442 01/18/20 0500  Weight: 111.1 kg 106.4 kg 105.5 kg    Examination:   Physical exam: General: Acutely ill-appearing morbidly obese female, orally intubated HEAENT: [place]/AT, eyes anicteric.  ETT and OGT in place Neuro: Sedated, paralyzed, not following commands.  Eyes are closed.  Pupils 3 mm bilateral reactive to light Chest: Basal crackles bilaterally, no wheezes or rhonchi Heart: Regular rate and rhythm, no murmurs or gallops Abdomen: Soft,  nontender, nondistended, bowel sounds present Skin: No rash   Resolved Hospital Problem list    Pneumomediastinum  Assessment & Plan:  Acute hypoxemic/hypercapnic respiratory failure due to COVID ARDS  Continue mechanical ventilation per ARDS protocol Target TVol 6 cc/kgIBW Target Plateau Pressure < 30cm H20 and driving pressure < 15 cm of water has been achieved Target PaO2 55-80: titrate PEEP/FiO2 per protocol Patient was proned yesterday, he will be supined this afternoon, then will repeat ABG afterwards and if PaO2 to FiO2 ratio is<150, consider prone position for 16 hours a day Ventilator associated pneumonia prevention protocol Continue baricitinib and steroid RASS goal -4/-5, continue Fentanyl and propofol infusion  Lasix challenge again as patient weight has been increasing, she was net +2 L in last 24 hours  Acute respiratory acidosis Patient pH is 7.28 with PCO2 of 56 I adjusted ventilatory setting to help clear hypercapnia Monitor ABGs  Hypertension Monitor  History of seizures Continue Keppra  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 On linagliptin, detemir and tube feeding coverage insulin  Best practice:  Diet: tube feeding Pain/Anxiety/Delirium protocol (if indicated): as above VAP protocol (if indicated): yes DVT prophylaxis: lovenox  GI prophylaxis: Pantoprazole for stress ulcer prophylaxis Glucose control: as above Mobility: bed rest Code Status: full Family Communication: Will update family today Disposition: remain in ICU  Labs   CBC: Recent Labs  Lab 01/13/20 [phone] 01/13/20 01/15/20 01/14/20 0802  01/14/20 0802 01/16/20 0752 01/16/20 0752 01/16/20 1851 01/17/20 0259 01/18/20 0359 01/18/20 1338 01/19/20 0500  WBC 9.0  --  11.9*  --  13.7*  --   --   --  17.4*  --  15.7*  NEUTROABS 8.0*  --  10.3*  --   --   --   --   --  15.4*  --   --   HGB 13.9   < > 14.0   < > 12.8   < > 12.6 12.9 12.8 12.2 11.4*  HCT 43.6   < > 43.1   < > 40.4   < > 37.0  38.0 41.6 36.0 37.8  MCV 89.9  --  88.5  --  91.4  --   --   --  95.9  --  95.2  PLT 164  --  183  --  162  --   --   --  191  --  174   < > = values in this interval not displayed.    Basic Metabolic Panel: Recent Labs  Lab 01/14/20 0802 01/14/20 0802 01/16/20 0450 01/16/20 0752 01/16/20 1851 01/16/20 2142 01/17/20 0259 01/17/20 0500 01/18/20 0359 01/18/20 1338 01/19/20 0500  NA 136   < >  --  140   < >  --  141 141 142 142 145  K 3.7   < >  --  4.3   < >  --  4.4 4.5 4.4 4.8 4.8  CL 102  --   --  109  --   --   --  109 109  --  109  CO2 24  --   --  23  --   --   --  24 25  --  28  GLUCOSE 202*  --   --  201*  --   --   --  228* 165*  --  177*  BUN 18  --   --  20  --   --   --  23* 28*  --  27*  CREATININE 0.62   < > 0.91 0.90  --   --   --  0.89 0.77  --  0.62  CALCIUM 8.0*  --   --  7.7*  --   --   --  8.1* 8.9  --  8.7*  MG  --   --   --   --   --  2.7*  --  2.8* 2.6*  --  2.3  PHOS  --   --   --   --   --  3.1  --  3.4 5.2*  --  4.0   < > = values in this interval not displayed.   GFR: Estimated Creatinine Clearance: 112.2 mL/min (by C-G formula based on SCr of 0.62 mg/dL). Recent Labs  Lab 01/14/20 0802 01/16/20 0752 01/16/20 2142 01/18/20 0359 01/19/20 0500  PROCALCITON  --   --  0.15  --   --   WBC 11.9* 13.7*  --  17.4* 15.7*    Liver Function Tests: Recent Labs  Lab 01/13/20 1610 01/14/20 0802 01/16/20 0752 01/17/20 0500 01/18/20 0359  AST 53* 45* 91* 37 26  ALT 32 29 138* 99* 76*  ALKPHOS 43 49 56 52 52  BILITOT 0.7 0.8 0.8 0.7 0.5  PROT 6.1* 6.1* 5.8* 5.8* 6.0*  ALBUMIN 2.6* 2.5* 2.4* 2.2* 2.3*   No results for input(s): LIPASE, AMYLASE in the last 168 hours. No results  for input(s): AMMONIA in the last 168 hours.  ABG    Component Value Date/Time   PHART 7.289 (L) 01/18/2020 1338   PCO2ART 56.0 (H) 01/18/2020 1338   PO2ART 75 (L) 01/18/2020 1338   HCO3 26.9 01/18/2020 1338   TCO2 29 01/18/2020 1338   ACIDBASEDEF 1.0 01/18/2020 1338    O2SAT 93.0 01/18/2020 1338     Coagulation Profile: No results for input(s): INR, PROTIME in the last 168 hours.  Cardiac Enzymes: No results for input(s): CKTOTAL, CKMB, CKMBINDEX, TROPONINI in the last 168 hours.  HbA1C: Hgb A1c MFr Bld  Date/Time Value Ref Range Status  01-27-2020 10:06 PM 6.2 (H) 4.8 - 5.6 % Final    Comment:    (NOTE) Pre diabetes:          5.7%-6.4%  Diabetes:              >6.4%  Glycemic control for   <7.0% adults with diabetes     CBG: Recent Labs  Lab 01/18/20 1524 01/18/20 1959 01/18/20 2350 01/19/20 0343 01/19/20 0829  GLUCAP 128* 156* 173* 178* 195*    Total critical care time: 37 minutes  Performed by: Cheri Fowler   Critical care time was exclusive of separately billable procedures and treating other patients.   Critical care was necessary to treat or prevent imminent or life-threatening deterioration.   Critical care was time spent personally by me on the following activities: development of treatment plan with patient and/or surrogate as well as nursing, discussions with consultants, evaluation of patient's response to treatment, examination of patient, obtaining history from patient or surrogate, ordering and performing treatments and interventions, ordering and review of laboratory studies, ordering and review of radiographic studies, pulse oximetry and re-evaluation of patient's condition.   Cheri Fowler MD Critical care physician [facility] [place] [facility]  Pager: [phone] Mobile: [phone]

## 2020-01-19 NOTE — Progress Notes (Signed)
Proned pt at 2225 with RNx4 and RTx2. Suctioned mouth and airway. No complications noted

## 2020-01-19 NOTE — Progress Notes (Signed)
Patient head turned to the Left.

## 2020-01-19 NOTE — Progress Notes (Signed)
Turned patients head to the Right

## 2020-01-19 NOTE — Progress Notes (Signed)
Assisted tele visit to patient with mother.  Jamie Nery Anderson, RN   

## 2020-01-19 NOTE — Progress Notes (Signed)
Pt supined at this time with no complications. Small blister noted to bottom center lip as well as large abrasion/blister to septum of nose. Mepilex added for protection. RN made aware. Ett secured with commercial tube holder in proper position. ABG to be obtained in approximately 1 hr to assess need for re-prone

## 2020-01-20 ENCOUNTER — Inpatient Hospital Stay: Payer: Self-pay

## 2020-01-20 LAB — GLUCOSE, CAPILLARY
Glucose-Capillary: 131 mg/dL — ABNORMAL HIGH (ref 70–99)
Glucose-Capillary: 140 mg/dL — ABNORMAL HIGH (ref 70–99)
Glucose-Capillary: 145 mg/dL — ABNORMAL HIGH (ref 70–99)
Glucose-Capillary: 154 mg/dL — ABNORMAL HIGH (ref 70–99)
Glucose-Capillary: 164 mg/dL — ABNORMAL HIGH (ref 70–99)
Glucose-Capillary: 165 mg/dL — ABNORMAL HIGH (ref 70–99)
Glucose-Capillary: 198 mg/dL — ABNORMAL HIGH (ref 70–99)

## 2020-01-20 LAB — BASIC METABOLIC PANEL
Anion gap: 8 (ref 5–15)
BUN: 32 mg/dL — ABNORMAL HIGH (ref 6–20)
CO2: 34 mmol/L — ABNORMAL HIGH (ref 22–32)
Calcium: 8.7 mg/dL — ABNORMAL LOW (ref 8.9–10.3)
Chloride: 102 mmol/L (ref 98–111)
Creatinine, Ser: 0.56 mg/dL (ref 0.44–1.00)
GFR calc Af Amer: >60 mL/min (ref >60)
GFR calc non Af Amer: >60 mL/min (ref >60)
Glucose, Bld: 196 mg/dL — ABNORMAL HIGH (ref 70–99)
Potassium: 4.9 mmol/L (ref 3.5–5.1)
Sodium: 144 mmol/L (ref 135–145)

## 2020-01-20 LAB — CBC WITH DIFFERENTIAL/PLATELET
Abs Immature Granulocytes: 0.62 10*3/uL — ABNORMAL HIGH (ref 0.00–0.07)
Basophils Absolute: 0 10*3/uL (ref 0.0–0.1)
Basophils Relative: 0 %
Eosinophils Absolute: 0.1 10*3/uL (ref 0.0–0.5)
Eosinophils Relative: 0 %
HCT: 39.4 % (ref 36.0–46.0)
Hemoglobin: 11.7 g/dL — ABNORMAL LOW (ref 12.0–15.0)
Immature Granulocytes: 3 %
Lymphocytes Relative: 1 %
Lymphs Abs: 0.3 10*3/uL — ABNORMAL LOW (ref 0.7–4.0)
MCH: 28.5 pg (ref 26.0–34.0)
MCHC: 29.7 g/dL — ABNORMAL LOW (ref 30.0–36.0)
MCV: 95.9 fL (ref 80.0–100.0)
Monocytes Absolute: 0.2 10*3/uL (ref 0.1–1.0)
Monocytes Relative: 1 %
Neutro Abs: 19.9 10*3/uL — ABNORMAL HIGH (ref 1.7–7.7)
Neutrophils Relative %: 95 %
Platelets: 169 10*3/uL (ref 150–400)
RBC: 4.11 MIL/uL (ref 3.87–5.11)
RDW: 13.3 % (ref 11.5–15.5)
WBC: 21.1 10*3/uL — ABNORMAL HIGH (ref 4.0–10.5)
nRBC: 0 % (ref 0.0–0.2)

## 2020-01-20 LAB — POCT I-STAT 7, (LYTES, BLD GAS, ICA,H+H)
Acid-Base Excess: 13 mmol/L — ABNORMAL HIGH (ref 0.0–2.0)
Acid-Base Excess: 9 mmol/L — ABNORMAL HIGH (ref 0.0–2.0)
Bicarbonate: 36.9 mmol/L — ABNORMAL HIGH (ref 20.0–28.0)
Bicarbonate: 41.2 mmol/L — ABNORMAL HIGH (ref 20.0–28.0)
Calcium, Ion: 1.22 mmol/L (ref 1.15–1.40)
Calcium, Ion: 1.23 mmol/L (ref 1.15–1.40)
HCT: 35 % — ABNORMAL LOW (ref 36.0–46.0)
HCT: 35 % — ABNORMAL LOW (ref 36.0–46.0)
Hemoglobin: 11.9 g/dL — ABNORMAL LOW (ref 12.0–15.0)
Hemoglobin: 11.9 g/dL — ABNORMAL LOW (ref 12.0–15.0)
O2 Saturation: 94 %
O2 Saturation: 95 %
Patient temperature: 98.6
Patient temperature: 99
Potassium: 4.6 mmol/L (ref 3.5–5.1)
Potassium: 4.6 mmol/L (ref 3.5–5.1)
Sodium: 143 mmol/L (ref 135–145)
Sodium: 143 mmol/L (ref 135–145)
TCO2: 39 mmol/L — ABNORMAL HIGH (ref 22–32)
TCO2: 43 mmol/L — ABNORMAL HIGH (ref 22–32)
pCO2 arterial: 68.3 mmHg (ref 32.0–48.0)
pCO2 arterial: 68.7 mmHg (ref 32.0–48.0)
pH, Arterial: 7.342 — ABNORMAL LOW (ref 7.350–7.450)
pH, Arterial: 7.386 (ref 7.350–7.450)
pO2, Arterial: 77 mmHg — ABNORMAL LOW (ref 83.0–108.0)
pO2, Arterial: 85 mmHg (ref 83.0–108.0)

## 2020-01-20 LAB — CULTURE, RESPIRATORY W GRAM STAIN: Gram Stain: NONE SEEN

## 2020-01-20 LAB — TRIGLYCERIDES: Triglycerides: 139 mg/dL (ref <150)

## 2020-01-20 MED ORDER — FUROSEMIDE 10 MG/ML IJ SOLN
60.0000 mg | Freq: Once | INTRAMUSCULAR | Status: AC
  Administered 2020-01-20: 60 mg via INTRAVENOUS
  Filled 2020-01-20: qty 6

## 2020-01-20 MED ORDER — CLONAZEPAM 1 MG PO TABS
1.0000 mg | ORAL_TABLET | Freq: Two times a day (BID) | ORAL | Status: DC
  Administered 2020-01-20 – 2020-01-21 (×3): 1 mg
  Filled 2020-01-20 (×3): qty 1

## 2020-01-20 MED ORDER — METHYLPREDNISOLONE SODIUM SUCC 125 MG IJ SOLR
50.0000 mg | Freq: Two times a day (BID) | INTRAMUSCULAR | Status: DC
  Administered 2020-01-20 – 2020-01-21 (×2): 50 mg via INTRAVENOUS
  Filled 2020-01-20 (×2): qty 2

## 2020-01-20 MED ORDER — SODIUM CHLORIDE 0.9% FLUSH
10.0000 mL | Freq: Two times a day (BID) | INTRAVENOUS | Status: DC
  Administered 2020-01-20 – 2020-01-23 (×5): 10 mL
  Administered 2020-01-24: 20 mL
  Administered 2020-01-24 – 2020-01-27 (×6): 10 mL

## 2020-01-20 MED ORDER — QUETIAPINE FUMARATE 50 MG PO TABS
50.0000 mg | ORAL_TABLET | Freq: Two times a day (BID) | ORAL | Status: DC
  Administered 2020-01-20 – 2020-01-21 (×3): 50 mg
  Filled 2020-01-20 (×3): qty 1

## 2020-01-20 MED ORDER — SODIUM CHLORIDE 0.9% FLUSH: 10.0000 mL | INTRAVENOUS | Status: DC | PRN

## 2020-01-20 NOTE — Progress Notes (Signed)
Pt returned to the supine position with RT x2 and RN x3. Cloth tape removed and no breakdown noted to the face or lips. Hollister tube holder placed onto pt and ETT resecured at 25cm. RT will continue to monitor.

## 2020-01-20 NOTE — Progress Notes (Signed)
Peripherally Inserted Central Catheter Placement  The IV Nurse has discussed with the patient and/or persons authorized to consent for the patient, the purpose of this procedure and the potential benefits and risks involved with this procedure.  The benefits include less needle sticks, lab draws from the catheter, and the patient may be discharged home with the catheter. Risks include, but not limited to, infection, bleeding, blood clot (thrombus formation), and puncture of an artery; nerve damage and irregular heartbeat and possibility to perform a PICC exchange if needed/ordered by physician.  Alternatives to this procedure were also discussed.  Bard Power PICC patient education guide, fact sheet on infection prevention and patient information card has been provided to patient /or left at bedside.  Phone consent obtain from mother.  PICC Placement Documentation  PICC Triple Lumen 01/20/20 PICC Right Basilic 39 cm 0 cm (Active)  Indication for Insertion or Continuance of Line Vasoactive infusions 01/20/20 2154  Exposed Catheter (cm) 0 cm 01/20/20 2154  Site Assessment Clean;Dry;Intact 01/20/20 2154  Lumen #1 Status Flushed;Saline locked;Blood return noted 01/20/20 2154  Lumen #2 Status Flushed;Saline locked;Blood return noted 01/20/20 2154  Lumen #3 Status Flushed;Saline locked;Blood return noted 01/20/20 2154  Dressing Type Transparent 01/20/20 2154  Dressing Status Clean;Intact;Dry 01/20/20 2154  Antimicrobial disc in place? Yes 01/20/20 2154  Safety Lock Not Applicable 01/20/20 2154  Dressing Intervention New dressing 01/20/20 2154  Dressing Change Due 01/27/20 01/20/20 2154       Ethelda Chick 01/20/2020, 9:56 PM

## 2020-01-20 NOTE — Progress Notes (Signed)
eLink Physician-Brief Progress Note Patient Name: Jamie Knapp DOB: 09-Oct-1975 MRN: [id number]   Date of Service  01/20/2020  HPI/Events of Note  Notified of redness and warmth on right arm. Not directly where PICC lines that was recently inserted  eICU Interventions  [place] ordered to rule out VTE and cellulitis     Intervention Category Major Interventions: Other:  Darl Pikes 01/20/2020, 11:56 PM

## 2020-01-20 NOTE — Progress Notes (Signed)
Turned pt head to the right. Suctioned mouth and airway. No complications noted.

## 2020-01-20 NOTE — Progress Notes (Signed)
NAME:  Jamie Knapp, MRN:  [id number], DOB:  1976/01/29, LOS: 11 ADMISSION DATE:  01/23/2020, CONSULTATION DATE:  9/18 REFERRING MD:  Jamie Knapp, CHIEF COMPLAINT:  Dyspnea   Brief History   44 year old woman with hx of HTN and seizures presenting with COVID ARDS.  Past Medical History  HTN Seizure dx  Significant Hospital Events   9/11 AP ER 9/17 Intubated  Consults:    Procedures:  9/17 ETT>  9/18 L IJ CVL >  9/18 L Radial arterial line >   Significant Diagnostic Tests:    Micro Data:  9/5 SARS COV 2 > positive 9/11 blood > neg 9/12 c diff > neg 9/18 resp culture > negative  Antimicrobials:  Doxycycline 9/13>>9/17 Ceftriaxone 9/13>>9/17 Cefepime  9/18>>9/22 Barcitinib 9/16>>   Interim history/subjective:  Patient was proned again yesterday, respiratory culture was negative, cefepime was stopped  Remained afebrile overnight Objective   Blood pressure 134/72, pulse (!) 106, temperature 98.5 F (36.9 C), temperature source Oral, resp. rate (!) 34, height 5\' 7"  (1.702 m), weight 105.5 kg, SpO2 91 %. CVP:  [13 mmHg-18 mmHg] 15 mmHg  Vent Mode: PRVC FiO2 (%):  [60 %-70 %] 70 % Set Rate:  [33 bmp] 33 bmp Vt Set:  [370 mL] 370 mL PEEP:  [16 cmH20] 16 cmH20 Plateau Pressure:  [24 cmH20-30 cmH20] 24 cmH20   Intake/Output Summary (Last 24 hours) at 01/20/2020 1244 Last data filed at 01/20/2020 1200 Gross per 24 hour  Intake 1393.03 ml  Output 2730 ml  Net -1336.97 ml   Filed Weights   01-23-20 1731 01/17/20 0442 01/18/20 0500  Weight: 111.1 kg 106.4 kg 105.5 kg    Examination:   Physical exam: General: Acutely ill-appearing morbidly obese female, orally intubated, currently proned HEAENT: [place]/AT, eyes anicteric.  ETT and OGT in place Neuro: Sedated, not following commands.  Eyes are closed.  Pupils 3 mm bilateral reactive to light Chest: Reduced air entry at the bases bilaterally, no wheezes or rhonchi Heart: Regular rate and rhythm, no murmurs or  gallops Abdomen: Soft, nontender, nondistended, bowel sounds present Skin: No rash   Resolved Hospital Problem list    Pneumomediastinum  Assessment & Plan:  Acute hypoxemic/hypercapnic respiratory failure due to COVID ARDS  Continue mechanical ventilation per ARDS protocol Target TVol 6 cc/kgIBW Target Plateau Pressure < 30cm H20 and driving pressure < 15 cm of water has been achieved Target PaO2 55-80: titrate PEEP/FiO2 per protocol Patient was proned last night again, she will be supine around 2 p.m. today  Continue Ventilator associated pneumonia prevention protocol Continue baricitinib Solu-Medrol was decreased from 60 mg twice daily to 50 mg RASS goal -4/-5, continue Fentanyl and propofol infusion  She remains net -2 L in last 24 hours, will give her 40 mg of Lasix again today  Acute respiratory acidosis, improving Today pH is 7.34 with PCO2 of 68 I adjusted ventilatory setting to help clear hypercapnia Monitor ABGs  Hypertension Monitor  History of seizures Continue Keppra  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 Patient hemoglobin A1c 6.2 On linagliptin, detemir and tube feeding coverage insulin  Best practice:  Diet: tube feeding Pain/Anxiety/Delirium protocol (if indicated): as above VAP protocol (if indicated): yes DVT prophylaxis: lovenox  GI prophylaxis: Pantoprazole for stress ulcer prophylaxis Glucose control: SSI Mobility: bed rest Code Status: full Family Communication: Patient's family was updated today over the phone Disposition: remain in ICU  Labs   CBC: Recent Labs  Lab 01/14/20 0802 01/14/20 0802 01/16/20 01/18/20  01/16/20 1851 01/18/20 0359 01/18/20 0359 01/18/20 1338 01/19/20 0500 01/19/20 1538 01/20/20 0012 01/20/20 0706  WBC 11.9*  --  13.7*  --  17.4*  --   --  15.7*  --   --  21.1*  NEUTROABS 10.3*  --   --   --  15.4*  --   --   --   --   --  19.9*  HGB 14.0   < > 12.8   < > 12.8   < > 12.2 11.4* 11.9* 11.9* 11.7*  HCT 43.1    < > 40.4   < > 41.6   < > 36.0 37.8 35.0* 35.0* 39.4  MCV 88.5  --  91.4  --  95.9  --   --  95.2  --   --  95.9  PLT 183  --  162  --  191  --   --  174  --   --  169   < > = values in this interval not displayed.    Basic Metabolic Panel: Recent Labs  Lab 01/16/20 0752 01/16/20 1851 01/16/20 2142 01/17/20 0259 01/17/20 0500 01/17/20 0500 01/18/20 0359 01/18/20 0359 01/18/20 1338 01/19/20 0500 01/19/20 1538 01/20/20 0012 01/20/20 0706  NA 140   < >  --    < > 141   < > 142   < > 142 145 144 143 144  K 4.3   < >  --    < > 4.5   < > 4.4   < > 4.8 4.8 4.1 4.6 4.9  CL 109  --   --   --  109  --  109  --   --  109  --   --  102  CO2 23  --   --   --  24  --  25  --   --  28  --   --  34*  GLUCOSE 201*  --   --   --  228*  --  165*  --   --  177*  --   --  196*  BUN 20  --   --   --  23*  --  28*  --   --  27*  --   --  32*  CREATININE 0.90  --   --   --  0.89  --  0.77  --   --  0.62  --   --  0.56  CALCIUM 7.7*  --   --   --  8.1*  --  8.9  --   --  8.7*  --   --  8.7*  MG  --   --  2.7*  --  2.8*  --  2.6*  --   --  2.3  --   --   --   PHOS  --   --  3.1  --  3.4  --  5.2*  --   --  4.0  --   --   --    < > = values in this interval not displayed.   GFR: Estimated Creatinine Clearance: 112.2 mL/min (by C-G formula based on SCr of 0.56 mg/dL). Recent Labs  Lab 01/16/20 0752 01/16/20 2142 01/18/20 0359 01/19/20 0500 01/20/20 0706  PROCALCITON  --  0.15  --   --   --   WBC 13.7*  --  17.4* 15.7* 21.1*    Liver Function Tests: Recent Labs  Lab 01/14/20 0802  01/16/20 0752 01/17/20 0500 01/18/20 0359  AST 45* 91* 37 26  ALT 29 138* 99* 76*  ALKPHOS 49 56 52 52  BILITOT 0.8 0.8 0.7 0.5  PROT 6.1* 5.8* 5.8* 6.0*  ALBUMIN 2.5* 2.4* 2.2* 2.3*   No results for input(s): LIPASE, AMYLASE in the last 168 hours. No results for input(s): AMMONIA in the last 168 hours.  ABG    Component Value Date/Time   PHART 7.342 (L) 01/20/2020 0012   PCO2ART 68.3 (HH) 01/20/2020  0012   PO2ART 85 01/20/2020 0012   HCO3 36.9 (H) 01/20/2020 0012   TCO2 39 (H) 01/20/2020 0012   ACIDBASEDEF 1.0 01/18/2020 1338   O2SAT 95.0 01/20/2020 0012     Coagulation Profile: No results for input(s): INR, PROTIME in the last 168 hours.  Cardiac Enzymes: No results for input(s): CKTOTAL, CKMB, CKMBINDEX, TROPONINI in the last 168 hours.  HbA1C: Hgb A1c MFr Bld  Date/Time Value Ref Range Status  02-05-20 10:06 PM 6.2 (H) 4.8 - 5.6 % Final    Comment:    (NOTE) Pre diabetes:          5.7%-6.4%  Diabetes:              >6.4%  Glycemic control for   <7.0% adults with diabetes     CBG: Recent Labs  Lab 01/19/20 1959 01/19/20 2349 01/20/20 0332 01/20/20 0833 01/20/20 1130  GLUCAP 131* 171* 165* 154* 198*    Total critical care time: 34 minutes  Performed by: Cheri Fowler   Critical care time was exclusive of separately billable procedures and treating other patients.   Critical care was necessary to treat or prevent imminent or life-threatening deterioration.   Critical care was time spent personally by me on the following activities: development of treatment plan with patient and/or surrogate as well as nursing, discussions with consultants, evaluation of patient's response to treatment, examination of patient, obtaining history from patient or surrogate, ordering and performing treatments and interventions, ordering and review of laboratory studies, ordering and review of radiographic studies, pulse oximetry and re-evaluation of patient's condition.   Cheri Fowler MD Critical care physician [facility]  Pager: [phone] Mobile: [phone]

## 2020-01-21 ENCOUNTER — Encounter (HOSPITAL_COMMUNITY): Payer: BC Managed Care – PPO

## 2020-01-21 ENCOUNTER — Inpatient Hospital Stay (HOSPITAL_COMMUNITY): Payer: BC Managed Care – PPO

## 2020-01-21 LAB — CBC WITH DIFFERENTIAL/PLATELET
Abs Immature Granulocytes: 0.39 10*3/uL — ABNORMAL HIGH (ref 0.00–0.07)
Basophils Absolute: 0 10*3/uL (ref 0.0–0.1)
Basophils Relative: 0 %
Eosinophils Absolute: 0.1 10*3/uL (ref 0.0–0.5)
Eosinophils Relative: 1 %
HCT: 38.5 % (ref 36.0–46.0)
Hemoglobin: 11.6 g/dL — ABNORMAL LOW (ref 12.0–15.0)
Immature Granulocytes: 3 %
Lymphocytes Relative: 4 %
Lymphs Abs: 0.7 10*3/uL (ref 0.7–4.0)
MCH: 29.2 pg (ref 26.0–34.0)
MCHC: 30.1 g/dL (ref 30.0–36.0)
MCV: 97 fL (ref 80.0–100.0)
Monocytes Absolute: 0.2 10*3/uL (ref 0.1–1.0)
Monocytes Relative: 1 %
Neutro Abs: 14.3 10*3/uL — ABNORMAL HIGH (ref 1.7–7.7)
Neutrophils Relative %: 91 %
Platelets: 174 10*3/uL (ref 150–400)
RBC: 3.97 MIL/uL (ref 3.87–5.11)
RDW: 13.2 % (ref 11.5–15.5)
WBC: 15.7 10*3/uL — ABNORMAL HIGH (ref 4.0–10.5)
nRBC: 0 % (ref 0.0–0.2)

## 2020-01-21 LAB — POCT I-STAT 7, (LYTES, BLD GAS, ICA,H+H)
Acid-Base Excess: 13 mmol/L — ABNORMAL HIGH (ref 0.0–2.0)
Bicarbonate: 40.4 mmol/L — ABNORMAL HIGH (ref 20.0–28.0)
Calcium, Ion: 1.25 mmol/L (ref 1.15–1.40)
HCT: 34 % — ABNORMAL LOW (ref 36.0–46.0)
Hemoglobin: 11.6 g/dL — ABNORMAL LOW (ref 12.0–15.0)
O2 Saturation: 96 %
Patient temperature: 99
Potassium: 4.6 mmol/L (ref 3.5–5.1)
Sodium: 145 mmol/L (ref 135–145)
TCO2: 42 mmol/L — ABNORMAL HIGH (ref 22–32)
pCO2 arterial: 66.8 mmHg (ref 32.0–48.0)
pH, Arterial: 7.391 (ref 7.350–7.450)
pO2, Arterial: 92 mmHg (ref 83.0–108.0)

## 2020-01-21 LAB — GLUCOSE, CAPILLARY
Glucose-Capillary: 127 mg/dL — ABNORMAL HIGH (ref 70–99)
Glucose-Capillary: 109 mg/dL — ABNORMAL HIGH (ref 70–99)
Glucose-Capillary: 176 mg/dL — ABNORMAL HIGH (ref 70–99)
Glucose-Capillary: 66 mg/dL — ABNORMAL LOW (ref 70–99)
Glucose-Capillary: 131 mg/dL — ABNORMAL HIGH (ref 70–99)
Glucose-Capillary: 189 mg/dL — ABNORMAL HIGH (ref 70–99)

## 2020-01-21 LAB — BASIC METABOLIC PANEL
Anion gap: 9 (ref 5–15)
BUN: 30 mg/dL — ABNORMAL HIGH (ref 6–20)
CO2: 35 mmol/L — ABNORMAL HIGH (ref 22–32)
Calcium: 8.8 mg/dL — ABNORMAL LOW (ref 8.9–10.3)
Chloride: 100 mmol/L (ref 98–111)
Creatinine, Ser: 0.52 mg/dL (ref 0.44–1.00)
GFR calc Af Amer: >60 mL/min (ref >60)
GFR calc non Af Amer: >60 mL/min (ref >60)
Glucose, Bld: 115 mg/dL — ABNORMAL HIGH (ref 70–99)
Potassium: 4.3 mmol/L (ref 3.5–5.1)
Sodium: 144 mmol/L (ref 135–145)

## 2020-01-21 MED ORDER — QUETIAPINE FUMARATE 100 MG PO TABS
100.0000 mg | ORAL_TABLET | Freq: Two times a day (BID) | ORAL | Status: DC
  Administered 2020-01-21 – 2020-01-27 (×13): 100 mg
  Filled 2020-01-21 (×13): qty 1

## 2020-01-21 MED ORDER — FUROSEMIDE 10 MG/ML IJ SOLN
40.0000 mg | Freq: Once | INTRAMUSCULAR | Status: AC
  Administered 2020-01-21: 40 mg via INTRAVENOUS
  Filled 2020-01-21: qty 4

## 2020-01-21 MED ORDER — CLONAZEPAM 1 MG PO TABS
2.0000 mg | ORAL_TABLET | Freq: Two times a day (BID) | ORAL | Status: DC
  Administered 2020-01-21 – 2020-01-27 (×13): 2 mg
  Filled 2020-01-21 (×13): qty 2

## 2020-01-21 MED ORDER — METHYLPREDNISOLONE SODIUM SUCC 40 MG IJ SOLR
40.0000 mg | Freq: Two times a day (BID) | INTRAMUSCULAR | Status: DC
  Administered 2020-01-21 – 2020-01-25 (×8): 40 mg via INTRAVENOUS
  Filled 2020-01-21 (×8): qty 1

## 2020-01-21 NOTE — Progress Notes (Signed)
NAME:  Jamie Knapp, MRN:  [id number], DOB:  Dec 11, 1975, LOS: 12 ADMISSION DATE:  01/08/2020, CONSULTATION DATE:  9/18 REFERRING MD:  Gwenlyn Perking, CHIEF COMPLAINT:  Dyspnea   Brief History   44 year old woman with hx of HTN and seizures presenting with COVID ARDS.  Past Medical History  HTN Seizure dx  Significant Hospital Events   9/11 AP ER 9/17 Intubated  Consults:    Procedures:  9/17 ETT>  9/18 L IJ CVL >  9/18 L Radial arterial line >   Significant Diagnostic Tests:    Micro Data:  9/5 SARS COV 2 > positive 9/11 blood > neg 9/12 c diff > neg 9/18 resp culture > negative  Antimicrobials:  Doxycycline 9/13>>9/17 Ceftriaxone 9/13>>9/17 Cefepime  9/18>>9/22 Barcitinib 9/16>>   Interim history/subjective:  Patient had to be proned last night with low P/ F ratio with improvement in oxygenation and gas exchange Objective   Blood pressure 134/75, pulse 87, temperature 98.1 F (36.7 C), temperature source Axillary, resp. rate (!) 30, height 5\' 7"  (1.702 m), weight 104.4 kg, SpO2 94 %. CVP:  [9 mmHg-12 mmHg] 12 mmHg  Vent Mode: PRVC FiO2 (%):  [50 %-70 %] 50 % Set Rate:  [34 bmp] 34 bmp Vt Set:  [370 mL] 370 mL PEEP:  [14 cmH20-16 cmH20] 14 cmH20 Plateau Pressure:  [25 cmH20-30 cmH20] 30 cmH20   Intake/Output Summary (Last 24 hours) at 01/21/2020 1032 Last data filed at 01/21/2020 0800 Gross per 24 hour  Intake 2348.27 ml  Output 3400 ml  Net -1051.73 ml   Filed Weights   01/17/20 0442 01/18/20 0500 01/21/20 0449  Weight: 106.4 kg 105.5 kg 104.4 kg    Examination:   Physical exam: General: Acutely ill-appearing morbidly obese female, orally intubated, currently proned HEAENT: [place]/AT, eyes anicteric.  ETT and OGT in place Neuro: Sedated, not following commands.  Eyes are closed.  Pupils 3 mm bilateral reactive to light Chest: Fine crackles heard at the bases bilaterally no wheezes or rhonchi Heart: Regular rate and rhythm, no murmurs or  gallops Abdomen: Soft, nontender, nondistended, bowel sounds present Skin: No rash   Resolved Hospital Problem list    Pneumomediastinum  Assessment & Plan:  Acute hypoxemic/hypercapnic respiratory failure due to COVID ARDS  Continue mechanical ventilation per ARDS protocol Target TVol 6 cc/kgIBW Target Plateau Pressure < 30cm H20 and driving pressure < 15 cm of water has been achieved Target PaO2 55-80: titrate PEEP/FiO2 per protocol Patient was proned last night again, with improvement in gas exchange Currently on FiO2 of 50% and PEEP of 14 Continue Ventilator associated pneumonia prevention protocol Continue baricitinib Solu-Medrol was decreased further to 40 mg twice daily RASS goal -4/-5, continue Fentanyl and propofol infusion  Trial of Lasix today  Acute respiratory acidosis, improving Patient's pH has improved to 7.39 but PCO2 still high Ventilatory settings were adjusted again Monitor ABGs  Hypertension Monitor  History of seizures Continue Keppra  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 Patient hemoglobin A1c 6.2 On linagliptin, detemir and tube feeding coverage insulin  Best practice:  Diet: tube feeding Pain/Anxiety/Delirium protocol (if indicated): as above VAP protocol (if indicated): yes DVT prophylaxis: lovenox  GI prophylaxis: Pantoprazole for stress ulcer prophylaxis Glucose control: SSI Mobility: bed rest Code Status: full Family Communication: I will update patient's family. Disposition: remain in ICU  Labs   CBC: Recent Labs  Lab 01/16/20 0752 01/16/20 1851 01/18/20 0359 01/18/20 1338 01/19/20 0500 01/19/20 1538 01/20/20 0012 01/20/20 0706 01/20/20 2020  01/21/20 0313 01/21/20 0446  WBC 13.7*  --  17.4*  --  15.7*  --   --  21.1*  --   --  15.7*  NEUTROABS  --   --  15.4*  --   --   --   --  19.9*  --   --  14.3*  HGB 12.8   < > 12.8   < > 11.4*   < > 11.9* 11.7* 11.9* 11.6* 11.6*  HCT 40.4   < > 41.6   < > 37.8   < > 35.0* 39.4  35.0* 34.0* 38.5  MCV 91.4  --  95.9  --  95.2  --   --  95.9  --   --  97.0  PLT 162  --  191  --  174  --   --  169  --   --  174   < > = values in this interval not displayed.    Basic Metabolic Panel: Recent Labs  Lab 01/16/20 2142 01/17/20 0259 01/17/20 0500 01/17/20 0500 01/18/20 0359 01/18/20 1338 01/19/20 0500 01/19/20 1538 01/20/20 0012 01/20/20 0706 01/20/20 2020 01/21/20 0313 01/21/20 0446  NA  --    < > 141   < > 142   < > 145   < > 143 144 143 145 144  K  --    < > 4.5   < > 4.4   < > 4.8   < > 4.6 4.9 4.6 4.6 4.3  CL  --   --  109  --  109  --  109  --   --  102  --   --  100  CO2  --   --  24  --  25  --  28  --   --  34*  --   --  35*  GLUCOSE  --   --  228*  --  165*  --  177*  --   --  196*  --   --  115*  BUN  --   --  23*  --  28*  --  27*  --   --  32*  --   --  30*  CREATININE  --   --  0.89  --  0.77  --  0.62  --   --  0.56  --   --  0.52  CALCIUM  --   --  8.1*  --  8.9  --  8.7*  --   --  8.7*  --   --  8.8*  MG 2.7*  --  2.8*  --  2.6*  --  2.3  --   --   --   --   --   --   PHOS 3.1  --  3.4  --  5.2*  --  4.0  --   --   --   --   --   --    < > = values in this interval not displayed.   GFR: Estimated Creatinine Clearance: 111.5 mL/min (by C-G formula based on SCr of 0.52 mg/dL). Recent Labs  Lab 01/16/20 0752 01/16/20 2142 01/18/20 0359 01/19/20 0500 01/20/20 0706 01/21/20 0446  PROCALCITON  --  0.15  --   --   --   --   WBC   < >  --  17.4* 15.7* 21.1* 15.7*   < > = values in this interval not displayed.    Liver  Function Tests: Recent Labs  Lab 01/16/20 0752 01/17/20 0500 01/18/20 0359  AST 91* 37 26  ALT 138* 99* 76*  ALKPHOS 56 52 52  BILITOT 0.8 0.7 0.5  PROT 5.8* 5.8* 6.0*  ALBUMIN 2.4* 2.2* 2.3*   No results for input(s): LIPASE, AMYLASE in the last 168 hours. No results for input(s): AMMONIA in the last 168 hours.  ABG    Component Value Date/Time   PHART 7.391 01/21/2020 0313   PCO2ART 66.8 (HH) 01/21/2020  0313   PO2ART 92 01/21/2020 0313   HCO3 40.4 (H) 01/21/2020 0313   TCO2 42 (H) 01/21/2020 0313   ACIDBASEDEF 1.0 01/18/2020 1338   O2SAT 96.0 01/21/2020 0313     Coagulation Profile: No results for input(s): INR, PROTIME in the last 168 hours.  Cardiac Enzymes: No results for input(s): CKTOTAL, CKMB, CKMBINDEX, TROPONINI in the last 168 hours.  HbA1C: Hgb A1c MFr Bld  Date/Time Value Ref Range Status  Jan 19, 2020 10:06 PM 6.2 (H) 4.8 - 5.6 % Final    Comment:    (NOTE) Pre diabetes:          5.7%-6.4%  Diabetes:              >6.4%  Glycemic control for   <7.0% adults with diabetes     CBG: Recent Labs  Lab 01/20/20 2000 01/20/20 2241 01/20/20 2356 01/21/20 0308 01/21/20 0744  GLUCAP 140* 131* 145* 127* 109*    Total critical care time: 34 minutes  Performed by: Cheri Fowler   Critical care time was exclusive of separately billable procedures and treating other patients.   Critical care was necessary to treat or prevent imminent or life-threatening deterioration.   Critical care was time spent personally by me on the following activities: development of treatment plan with patient and/or surrogate as well as nursing, discussions with consultants, evaluation of patient's response to treatment, examination of patient, obtaining history from patient or surrogate, ordering and performing treatments and interventions, ordering and review of laboratory studies, ordering and review of radiographic studies, pulse oximetry and re-evaluation of patient's condition.   Cheri Fowler MD Critical care physician [facility] [place] [facility]  Pager: [phone] Mobile: [phone]

## 2020-01-21 NOTE — Progress Notes (Signed)
Pt's is proned. Head turned to the left.

## 2020-01-21 NOTE — Progress Notes (Signed)
Pt's head turned and arms rotated with no complications. ETT secured °

## 2020-01-21 NOTE — Progress Notes (Signed)
Per Cheri Fowler, MD continue with prone. Do not supine pt.

## 2020-01-21 NOTE — Progress Notes (Signed)
Pt proned with RNx4 and RTx2. Suctioned mouth and airway. No complications noted.

## 2020-01-21 NOTE — Progress Notes (Signed)
eLink Physician-Brief Progress Note Patient Name: Jamie Knapp DOB: February 06, 1976 MRN: [id number]   Date of Service  01/21/2020  HPI/Events of Note  Request for am labs  eICU Interventions  CBC, BMP ordered     Intervention Category Minor Interventions: Other:  Darl Pikes 01/21/2020, 4:01 AM

## 2020-01-22 LAB — COMPREHENSIVE METABOLIC PANEL
ALT: 31 U/L (ref 0–44)
AST: 17 U/L (ref 15–41)
Albumin: 2 g/dL — ABNORMAL LOW (ref 3.5–5.0)
Alkaline Phosphatase: 44 U/L (ref 38–126)
Anion gap: 8 (ref 5–15)
BUN: 32 mg/dL — ABNORMAL HIGH (ref 6–20)
CO2: 34 mmol/L — ABNORMAL HIGH (ref 22–32)
Calcium: 8.8 mg/dL — ABNORMAL LOW (ref 8.9–10.3)
Chloride: 103 mmol/L (ref 98–111)
Creatinine, Ser: 0.51 mg/dL (ref 0.44–1.00)
GFR calc Af Amer: >60 mL/min (ref >60)
GFR calc non Af Amer: >60 mL/min (ref >60)
Glucose, Bld: 161 mg/dL — ABNORMAL HIGH (ref 70–99)
Potassium: 4.3 mmol/L (ref 3.5–5.1)
Sodium: 145 mmol/L (ref 135–145)
Total Bilirubin: 0.7 mg/dL (ref 0.3–1.2)
Total Protein: 5.8 g/dL — ABNORMAL LOW (ref 6.5–8.1)

## 2020-01-22 LAB — GLUCOSE, CAPILLARY
Glucose-Capillary: 150 mg/dL — ABNORMAL HIGH (ref 70–99)
Glucose-Capillary: 166 mg/dL — ABNORMAL HIGH (ref 70–99)
Glucose-Capillary: 149 mg/dL — ABNORMAL HIGH (ref 70–99)
Glucose-Capillary: 124 mg/dL — ABNORMAL HIGH (ref 70–99)
Glucose-Capillary: 140 mg/dL — ABNORMAL HIGH (ref 70–99)
Glucose-Capillary: 185 mg/dL — ABNORMAL HIGH (ref 70–99)

## 2020-01-22 LAB — POCT I-STAT 7, (LYTES, BLD GAS, ICA,H+H)
Acid-Base Excess: 9 mmol/L — ABNORMAL HIGH (ref 0.0–2.0)
Acid-Base Excess: 9 mmol/L — ABNORMAL HIGH (ref 0.0–2.0)
Bicarbonate: 35.4 mmol/L — ABNORMAL HIGH (ref 20.0–28.0)
Bicarbonate: 35.4 mmol/L — ABNORMAL HIGH (ref 20.0–28.0)
Calcium, Ion: 1.21 mmol/L (ref 1.15–1.40)
Calcium, Ion: 1.22 mmol/L (ref 1.15–1.40)
HCT: 35 % — ABNORMAL LOW (ref 36.0–46.0)
HCT: 40 % (ref 36.0–46.0)
Hemoglobin: 11.9 g/dL — ABNORMAL LOW (ref 12.0–15.0)
Hemoglobin: 13.6 g/dL (ref 12.0–15.0)
O2 Saturation: 95 %
O2 Saturation: 91 %
Potassium: 4.4 mmol/L (ref 3.5–5.1)
Potassium: 4.2 mmol/L (ref 3.5–5.1)
Sodium: 143 mmol/L (ref 135–145)
Sodium: 141 mmol/L (ref 135–145)
TCO2: 37 mmol/L — ABNORMAL HIGH (ref 22–32)
TCO2: 37 mmol/L — ABNORMAL HIGH (ref 22–32)
pCO2 arterial: 54.3 mmHg — ABNORMAL HIGH (ref 32.0–48.0)
pCO2 arterial: 53.4 mmHg — ABNORMAL HIGH (ref 32.0–48.0)
pH, Arterial: 7.422 (ref 7.350–7.450)
pH, Arterial: 7.43 (ref 7.350–7.450)
pO2, Arterial: 79 mmHg — ABNORMAL LOW (ref 83.0–108.0)
pO2, Arterial: 61 mmHg — ABNORMAL LOW (ref 83.0–108.0)

## 2020-01-22 LAB — MAGNESIUM: Magnesium: 2.1 mg/dL (ref 1.7–2.4)

## 2020-01-22 LAB — PHOSPHORUS: Phosphorus: 4.2 mg/dL (ref 2.5–4.6)

## 2020-01-22 MED ORDER — VECURONIUM BROMIDE 10 MG IV SOLR
10.0000 mg | Freq: Once | INTRAVENOUS | Status: AC
  Administered 2020-01-22: 10 mg via INTRAVENOUS
  Filled 2020-01-22: qty 10

## 2020-01-22 MED ORDER — FUROSEMIDE 10 MG/ML IJ SOLN
60.0000 mg | Freq: Once | INTRAMUSCULAR | Status: AC
  Administered 2020-01-22: 60 mg via INTRAVENOUS
  Filled 2020-01-22: qty 6

## 2020-01-22 MED ORDER — DIPHENHYDRAMINE HCL 50 MG/ML IJ SOLN
12.5000 mg | Freq: Once | INTRAMUSCULAR | Status: AC
  Administered 2020-01-22: 12.5 mg via INTRAVENOUS
  Filled 2020-01-22: qty 1

## 2020-01-22 NOTE — Progress Notes (Addendum)
During 2000 assessment, BUE noted to be swollen(L > R), cool to touch, pale/pink, and cap refill >3 secs. Warm blanket placed on pt and Elink notified. At 2220, BUE still swollen as above, but now warm, pink/red, with cap refill <3 secs. Orders for BUE venous dopplers to r/o DVT. Will continue to monitor pt closely.

## 2020-01-22 NOTE — Plan of Care (Signed)

## 2020-01-22 NOTE — Progress Notes (Signed)
eLink Physician-Brief Progress Note Patient Name: Juliannah Ohmann DOB: 1975-07-20 MRN: [id number]   Date of Service  01/22/2020  HPI/Events of Note  Bilateral upper extremity edema, capillary refill and pulses  intact bilaterally.  eICU Interventions  Bilateral upper extremity venous dopplers ordered to exclude DVT.        Thomasene Lot Nandini Bogdanski 01/22/2020, 10:38 PM

## 2020-01-22 NOTE — Progress Notes (Signed)
Pt supinated without any complications. ETT re-secured with commercial tube holder.

## 2020-01-22 NOTE — Progress Notes (Signed)
NAME:  Jamie Knapp, MRN:  [id number], DOB:  11-02-1975, LOS: 13 ADMISSION DATE:  01-29-2020, CONSULTATION DATE:  9/18 REFERRING MD:  Gwenlyn Perking, CHIEF COMPLAINT:  Dyspnea   Brief History   44 year old woman with hx of HTN and seizures presenting with COVID ARDS.  Past Medical History  HTN Seizure dx  Significant Hospital Events   9/11 AP ER 9/17 Intubated  Consults:    Procedures:  9/17 ETT>  9/18 L IJ CVL >  9/18 L Radial arterial line >   Significant Diagnostic Tests:    Micro Data:  9/5 SARS COV 2 > positive 9/11 blood > neg 9/12 c diff > neg 9/18 resp culture > negative  Antimicrobials:  Doxycycline 9/13>>9/17 Ceftriaxone 9/13>>9/17 Cefepime  9/18>>9/22 Barcitinib 9/16>>   Interim history/subjective:  Patient was supine this morning, without any complications.  Objective   Blood pressure (!) 143/75, pulse 89, temperature 98.7 F (37.1 C), temperature source Oral, resp. rate (!) 34, height 5\' 7"  (1.702 m), weight 105.3 kg, SpO2 95 %.    Vent Mode: PRVC FiO2 (%):  [50 %-70 %] 70 % Set Rate:  [34 bmp] 34 bmp Vt Set:  [370 mL] 370 mL PEEP:  [12 cmH20-14 cmH20] 14 cmH20 Plateau Pressure:  [25 cmH20-30 cmH20] 30 cmH20   Intake/Output Summary (Last 24 hours) at 01/22/2020 1249 Last data filed at 01/22/2020 1200 Gross per 24 hour  Intake 2463.51 ml  Output 2515 ml  Net -51.49 ml   Filed Weights   01/18/20 0500 01/21/20 0449 01/22/20 0355  Weight: 105.5 kg 104.4 kg 105.3 kg    Examination:   Physical exam: General: Acutely ill-appearing morbidly obese female, orally intubated, currently supine HEAENT: [place]/AT, eyes anicteric.  ETT and OGT in place Neuro: Sedated, not following commands.  Eyes are closed.  Pupils 3 mm bilateral reactive to light Chest: Fine crackles heard at the bases bilaterally no wheezes or rhonchi Heart: Regular rate and rhythm, no murmurs or gallops Abdomen: Soft, nontender, nondistended, bowel sounds present Skin: No  rash   Resolved Hospital Problem list    Pneumomediastinum  Assessment & Plan:  Acute hypoxemic/hypercapnic respiratory failure due to COVID ARDS  Continue mechanical ventilation per ARDS protocol Patient's lung mechanics are slightly better now, currently on FiO2 of 50% and PEEP of 12 with O2 sat in low 90s We will continue to come down on PEEP now Target Plateau Pressure < 30cm H20 and driving pressure < 15 cm of water has been achieved Target PaO2 55-80: titrate PEEP/FiO2 per protocol Continue Ventilator associated pneumonia prevention protocol Continue baricitinib Continue Solu-Medrol 40 mg twice daily RASS goal -4/-5, continue Fentanyl and propofol infusion  IV Lasix x1 today  Acute respiratory acidosis, improving Patient's pH has improved, back to within normal limits PCO2 is trending down to 50s Monitor ABGs  Hypertension Controlled, monitor  History of seizures Continue Keppra  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 Patient hemoglobin A1c 6.2 On linagliptin, detemir and tube feeding coverage insulin  Best practice:  Diet: tube feeding Pain/Anxiety/Delirium protocol (if indicated): as above VAP protocol (if indicated): yes DVT prophylaxis: lovenox  GI prophylaxis: Pantoprazole for stress ulcer prophylaxis Glucose control: SSI Mobility: bed rest Code Status: full Family Communication: Patient's family was updated over the phone Disposition: remain in ICU  Labs   CBC: Recent Labs  Lab 01/16/20 0752 01/16/20 1851 01/18/20 0359 01/18/20 1338 01/19/20 0500 01/19/20 1538 01/20/20 01/22/20 01/20/20 01/22/20 01/20/20 2020 01/21/20 0313 01/21/20 0446 01/22/20 0305 01/22/20  1100  WBC 13.7*  --  17.4*  --  15.7*  --  21.1*  --   --   --  15.7*  --   --   NEUTROABS  --   --  15.4*  --   --   --  19.9*  --   --   --  14.3*  --   --   HGB 12.8   < > 12.8   < > 11.4*   < > 11.7*   < > 11.9* 11.6* 11.6* 11.9* 13.6  HCT 40.4   < > 41.6   < > 37.8   < > 39.4   < >  35.0* 34.0* 38.5 35.0* 40.0  MCV 91.4  --  95.9  --  95.2  --  95.9  --   --   --  97.0  --   --   PLT 162  --  191  --  174  --  169  --   --   --  174  --   --    < > = values in this interval not displayed.    Basic Metabolic Panel: Recent Labs  Lab 01/16/20 2142 01/17/20 0259 01/17/20 0500 01/17/20 0500 01/18/20 0359 01/18/20 1338 01/19/20 0500 01/19/20 1538 01/20/20 0706 01/20/20 2020 01/21/20 0313 01/21/20 0446 01/22/20 0305 01/22/20 0341 01/22/20 1100  NA  --    < > 141   < > 142   < > 145   < > 144   < > 145 144 143 145 141  K  --    < > 4.5   < > 4.4   < > 4.8   < > 4.9   < > 4.6 4.3 4.4 4.3 4.2  CL  --   --  109   < > 109  --  109  --  102  --   --  100  --  103  --   CO2  --   --  24   < > 25  --  28  --  34*  --   --  35*  --  34*  --   GLUCOSE  --   --  228*   < > 165*  --  177*  --  196*  --   --  115*  --  161*  --   BUN  --   --  23*   < > 28*  --  27*  --  32*  --   --  30*  --  32*  --   CREATININE  --   --  0.89   < > 0.77  --  0.62  --  0.56  --   --  0.52  --  0.51  --   CALCIUM  --   --  8.1*   < > 8.9  --  8.7*  --  8.7*  --   --  8.8*  --  8.8*  --   MG 2.7*  --  2.8*  --  2.6*  --  2.3  --   --   --   --   --   --  2.1  --   PHOS 3.1  --  3.4  --  5.2*  --  4.0  --   --   --   --   --   --  4.2  --    < > = values in  this interval not displayed.   GFR: Estimated Creatinine Clearance: 112.1 mL/min (by C-G formula based on SCr of 0.51 mg/dL). Recent Labs  Lab 01/16/20 0752 01/16/20 2142 01/18/20 0359 01/19/20 0500 01/20/20 0706 01/21/20 0446  PROCALCITON  --  0.15  --   --   --   --   WBC   < >  --  17.4* 15.7* 21.1* 15.7*   < > = values in this interval not displayed.    Liver Function Tests: Recent Labs  Lab 01/16/20 0752 01/17/20 0500 01/18/20 0359 01/22/20 0341  AST 91* 37 26 17  ALT 138* 99* 76* 31  ALKPHOS 56 52 52 44  BILITOT 0.8 0.7 0.5 0.7  PROT 5.8* 5.8* 6.0* 5.8*  ALBUMIN 2.4* 2.2* 2.3* 2.0*   No results for input(s):  LIPASE, AMYLASE in the last 168 hours. No results for input(s): AMMONIA in the last 168 hours.  ABG    Component Value Date/Time   PHART 7.430 01/22/2020 1100   PCO2ART 53.4 (H) 01/22/2020 1100   PO2ART 61 (L) 01/22/2020 1100   HCO3 35.4 (H) 01/22/2020 1100   TCO2 37 (H) 01/22/2020 1100   ACIDBASEDEF 1.0 01/18/2020 1338   O2SAT 91.0 01/22/2020 1100     Coagulation Profile: No results for input(s): INR, PROTIME in the last 168 hours.  Cardiac Enzymes: No results for input(s): CKTOTAL, CKMB, CKMBINDEX, TROPONINI in the last 168 hours.  HbA1C: Hgb A1c MFr Bld  Date/Time Value Ref Range Status  08-Feb-2020 10:06 PM 6.2 (H) 4.8 - 5.6 % Final    Comment:    (NOTE) Pre diabetes:          5.7%-6.4%  Diabetes:              >6.4%  Glycemic control for   <7.0% adults with diabetes     CBG: Recent Labs  Lab 01/21/20 1959 01/21/20 2315 01/22/20 0345 01/22/20 0805 01/22/20 1213  GLUCAP 131* 189* 150* 166* 149*    Total critical care time: 31 minutes  Performed by: Cheri Fowler   Critical care time was exclusive of separately billable procedures and treating other patients.   Critical care was necessary to treat or prevent imminent or life-threatening deterioration.   Critical care was time spent personally by me on the following activities: development of treatment plan with patient and/or surrogate as well as nursing, discussions with consultants, evaluation of patient's response to treatment, examination of patient, obtaining history from patient or surrogate, ordering and performing treatments and interventions, ordering and review of laboratory studies, ordering and review of radiographic studies, pulse oximetry and re-evaluation of patient's condition.   Cheri Fowler MD Critical care physician [facility] [place] [facility]  Pager: [phone] Mobile: [phone]

## 2020-01-22 NOTE — Progress Notes (Signed)
eLink Physician-Brief Progress Note Patient Name: Jamie Knapp DOB: [birth year] MRN: [id number]   Date of Service  01/22/2020  HPI/Events of Note  Patient with a blanching rash over her back and arms of unclear etiology, no changes in vital signs, no clear trigger although she had a bath with soap followed by [facility] tonight.  eICU Interventions  Will order PRN Benadryl and observe closely.        Thomasene Lot Kollen Armenti 01/22/2020, 12:32 AM

## 2020-01-22 NOTE — Progress Notes (Signed)
Assisted tele visit to patient with family member.  Husain Costabile Anderson, RN   

## 2020-01-23 ENCOUNTER — Encounter (HOSPITAL_COMMUNITY): Payer: BC Managed Care – PPO

## 2020-01-23 LAB — POCT I-STAT 7, (LYTES, BLD GAS, ICA,H+H)
Acid-Base Excess: 9 mmol/L — ABNORMAL HIGH (ref 0.0–2.0)
Bicarbonate: 36.2 mmol/L — ABNORMAL HIGH (ref 20.0–28.0)
Calcium, Ion: 1.23 mmol/L (ref 1.15–1.40)
HCT: 36 % (ref 36.0–46.0)
Hemoglobin: 12.2 g/dL (ref 12.0–15.0)
O2 Saturation: 94 %
Patient temperature: 99.2
Potassium: 4.5 mmol/L (ref 3.5–5.1)
Sodium: 142 mmol/L (ref 135–145)
TCO2: 38 mmol/L — ABNORMAL HIGH (ref 22–32)
pCO2 arterial: 61.5 mmHg — ABNORMAL HIGH (ref 32.0–48.0)
pH, Arterial: 7.379 (ref 7.350–7.450)
pO2, Arterial: 75 mmHg — ABNORMAL LOW (ref 83.0–108.0)

## 2020-01-23 LAB — CBC
HCT: 39.7 % (ref 36.0–46.0)
Hemoglobin: 11.8 g/dL — ABNORMAL LOW (ref 12.0–15.0)
MCH: 28.2 pg (ref 26.0–34.0)
MCHC: 29.7 g/dL — ABNORMAL LOW (ref 30.0–36.0)
MCV: 94.7 fL (ref 80.0–100.0)
Platelets: 210 10*3/uL (ref 150–400)
RBC: 4.19 MIL/uL (ref 3.87–5.11)
RDW: 13.1 % (ref 11.5–15.5)
WBC: 14 10*3/uL — ABNORMAL HIGH (ref 4.0–10.5)
nRBC: 0 % (ref 0.0–0.2)

## 2020-01-23 LAB — GLUCOSE, CAPILLARY
Glucose-Capillary: 114 mg/dL — ABNORMAL HIGH (ref 70–99)
Glucose-Capillary: 126 mg/dL — ABNORMAL HIGH (ref 70–99)
Glucose-Capillary: 147 mg/dL — ABNORMAL HIGH (ref 70–99)
Glucose-Capillary: 159 mg/dL — ABNORMAL HIGH (ref 70–99)
Glucose-Capillary: 164 mg/dL — ABNORMAL HIGH (ref 70–99)
Glucose-Capillary: 171 mg/dL — ABNORMAL HIGH (ref 70–99)

## 2020-01-23 LAB — BASIC METABOLIC PANEL
Anion gap: 10 (ref 5–15)
BUN: 39 mg/dL — ABNORMAL HIGH (ref 6–20)
CO2: 33 mmol/L — ABNORMAL HIGH (ref 22–32)
Calcium: 8.8 mg/dL — ABNORMAL LOW (ref 8.9–10.3)
Chloride: 101 mmol/L (ref 98–111)
Creatinine, Ser: 0.61 mg/dL (ref 0.44–1.00)
GFR calc Af Amer: >60 mL/min (ref >60)
GFR calc non Af Amer: >60 mL/min (ref >60)
Glucose, Bld: 167 mg/dL — ABNORMAL HIGH (ref 70–99)
Potassium: 4.3 mmol/L (ref 3.5–5.1)
Sodium: 144 mmol/L (ref 135–145)

## 2020-01-23 LAB — MAGNESIUM: Magnesium: 2.2 mg/dL (ref 1.7–2.4)

## 2020-01-23 MED ORDER — VECURONIUM BOLUS VIA INFUSION
0.0800 mg/kg | Freq: Once | INTRAVENOUS | Status: DC
  Filled 2020-01-23: qty 9

## 2020-01-23 MED ORDER — BISACODYL 10 MG RE SUPP: 10.0000 mg | Freq: Every day | RECTAL | Status: DC | PRN

## 2020-01-23 MED ORDER — FUROSEMIDE 10 MG/ML IJ SOLN
40.0000 mg | Freq: Once | INTRAMUSCULAR | Status: AC
  Administered 2020-01-23: 40 mg via INTRAVENOUS
  Filled 2020-01-23: qty 4

## 2020-01-23 MED ORDER — PROPOFOL 1000 MG/100ML IV EMUL
5.0000 ug/kg/min | INTRAVENOUS | Status: DC
  Administered 2020-01-23 – 2020-01-24 (×6): 30 ug/kg/min via INTRAVENOUS
  Administered 2020-01-24: 20 ug/kg/min via INTRAVENOUS
  Administered 2020-01-24: 30 ug/kg/min via INTRAVENOUS
  Administered 2020-01-25: 80 ug/kg/min via INTRAVENOUS
  Administered 2020-01-25: 70 ug/kg/min via INTRAVENOUS
  Administered 2020-01-25: 30 ug/kg/min via INTRAVENOUS
  Administered 2020-01-25: 50 ug/kg/min via INTRAVENOUS
  Administered 2020-01-25 (×2): 80 ug/kg/min via INTRAVENOUS
  Administered 2020-01-25 (×2): 50 ug/kg/min via INTRAVENOUS
  Administered 2020-01-26: 70 ug/kg/min via INTRAVENOUS
  Administered 2020-01-26 (×4): 80 ug/kg/min via INTRAVENOUS
  Administered 2020-01-26: 70 ug/kg/min via INTRAVENOUS
  Administered 2020-01-26: 80 ug/kg/min via INTRAVENOUS
  Administered 2020-01-26: 70 ug/kg/min via INTRAVENOUS
  Administered 2020-01-26 (×3): 80 ug/kg/min via INTRAVENOUS
  Administered 2020-01-26: 70 ug/kg/min via INTRAVENOUS
  Administered 2020-01-27 (×2): 55 ug/kg/min via INTRAVENOUS
  Administered 2020-01-27: 80 ug/kg/min via INTRAVENOUS
  Administered 2020-01-27 (×2): 55 ug/kg/min via INTRAVENOUS
  Administered 2020-01-27: 80 ug/kg/min via INTRAVENOUS
  Administered 2020-01-27: 40 ug/kg/min via INTRAVENOUS
  Administered 2020-01-27: 60 ug/kg/min via INTRAVENOUS
  Administered 2020-01-27: 55 ug/kg/min via INTRAVENOUS
  Filled 2020-01-23 (×2): qty 100
  Filled 2020-01-23: qty 200
  Filled 2020-01-23 (×3): qty 100
  Filled 2020-01-23: qty 200
  Filled 2020-01-23 (×29): qty 100

## 2020-01-23 MED ORDER — ARTIFICIAL TEARS OPHTHALMIC OINT
1.0000 "application " | TOPICAL_OINTMENT | Freq: Three times a day (TID) | OPHTHALMIC | Status: DC
  Administered 2020-01-23 – 2020-01-25 (×6): 1 via OPHTHALMIC
  Filled 2020-01-23 (×2): qty 3.5

## 2020-01-23 MED ORDER — DOCUSATE SODIUM 50 MG/5ML PO LIQD
50.0000 mg | Freq: Every day | ORAL | Status: DC
  Administered 2020-01-23 – 2020-01-27 (×4): 50 mg
  Filled 2020-01-23 (×4): qty 10

## 2020-01-23 MED ORDER — POLYETHYLENE GLYCOL 3350 17 G PO PACK: 17.0000 g | PACK | Freq: Every day | ORAL | Status: DC | PRN

## 2020-01-23 MED ORDER — VECURONIUM BROMIDE 10 MG IV SOLR
0.0000 ug/kg/min | INTRAVENOUS | Status: DC
  Filled 2020-01-23: qty 100

## 2020-01-23 NOTE — Progress Notes (Signed)
Pt proned right side face down. No complications noted.

## 2020-01-23 NOTE — Progress Notes (Signed)
Assisted tele visit to patient with family member.  Lizzette Carbonell Ann, RN  

## 2020-01-23 NOTE — Progress Notes (Signed)
MD notified:  started the prop, versed is almost off; gave a vec push just before proning ~1500.  She's doing so well- HR in 80's; vent compliant; not opening her eyes or moving around yet so I'm holding on the paralytic gtt to see if she needs it.  I think part of the problem was her foley - I manipulated it and it started flowing and she calmed down shortly after.   Tube feeds at 20 - vomited a little after proning and I had it at 45 then.  Hasn't had much output in her flexi.  Maybe need gentle softner? Belly is soft with positive BS.  Bowel regimen added. MD stated to hold paralytic gtt for now given tolerating vent and current sedation.  MD notified:  Holding tube feeds for now.  Emesis looking drainage continuing from her mouth.   MD replied ok for night RN to put OG to [facility] to decompress.

## 2020-01-23 NOTE — Progress Notes (Signed)
Pt dyssynchronous with ventilator, SpO2-75% on FiO2 .50, PEEP-10. Pt given fentanyl bolus, 2mg  versed with SpO2 increasing to 80-83%. FiO2 increased to .80 with SpO2 initially increasing to 93% but then dropping back to mid-80s. Pt given fentanyl again, increased versed from 8 to 10mg ,  and FiO2 on ventilator to 100%. SpO2 increased to 92% after interventions. Will continue to monitor closely.

## 2020-01-23 NOTE — Progress Notes (Signed)
NAME:  Jamie Knapp, MRN:  [id number], DOB:  [birth year], LOS: 14 ADMISSION DATE:  Jan 13, 2020, CONSULTATION DATE:  9/18 REFERRING MD:  Gwenlyn Perking, CHIEF COMPLAINT:  Dyspnea   Brief History   44 year old woman with hx of HTN and seizures presenting with COVID ARDS.  Past Medical History  HTN Seizure dx  Significant Hospital Events   9/11 AP ER 9/17 Intubated  Consults:    Procedures:  9/17 ETT>  9/18 L IJ CVL >  9/18 L Radial arterial line >   Significant Diagnostic Tests:    Micro Data:  9/5 SARS COV 2 > positive 9/11 blood > neg 9/12 c diff > neg 9/18 resp culture > negative  Antimicrobials:  Doxycycline 9/13>>9/17 Ceftriaxone 9/13>>9/17 Cefepime  9/18>>9/22 Barcitinib 9/16>>   Interim history/subjective:  Significant ventilator dyssynchrony overnight. Awake and following commands on versed and fentanyl. Still getting vecuronium pushes.  Objective   Blood pressure (!) 143/75, pulse 99, temperature 99.2 F (37.3 C), temperature source Oral, resp. rate 17, height 5\' 7"  (1.702 m), weight 105.7 kg, SpO2 93 %.    Vent Mode: PRVC FiO2 (%):  [50 %-100 %] 80 % Set Rate:  [34 bmp] 34 bmp Vt Set:  [370 mL] 370 mL PEEP:  [10 cmH20-14 cmH20] 14 cmH20 Plateau Pressure:  [24 cmH20-25 cmH20] 25 cmH20   Intake/Output Summary (Last 24 hours) at 01/23/2020 0934 Last data filed at 01/23/2020 0700 Gross per 24 hour  Intake 2855.82 ml  Output 2140 ml  Net 715.82 ml   Filed Weights   01/21/20 0449 01/22/20 0355 01/23/20 0400  Weight: 104.4 kg 105.3 kg 105.7 kg    Examination:   Physical exam: General: Acutely ill-appearing morbidly obese female, orally intubated, currently supine HEAENT: [place]/AT, eyes anicteric.  ETT and OGT in place Neuro: Sedated, not following commands.  Eyes are closed.   Chest: Fine crackles heard at the bases bilaterally no wheezes or rhonchi Heart: Regular rate and rhythm, no murmurs or gallops Abdomen: Soft, nontender, nondistended, bowel  sounds present Skin: No rash   Resolved Hospital Problem list    Pneumomediastinum  Assessment & Plan:  Acute hypoxemic/hypercapnic respiratory failure due to COVID ARDS  Continue mechanical ventilation per ARDS protocol P/F ratio persistently<100. Will reprone. Goal Target Plateau Pressure < 30cm H20 and driving pressure < 15 cm of water Target PaO2 55-80: titrate PEEP/FiO2 per protocol Continue Ventilator associated pneumonia prevention protocol Continue baricitinib Continue Solu-Medrol 40 mg twice daily RASS goal -4/-5, continue Fentanyl. Not sedated enough with versed. Propofol was discontinued 9/22. Will obtain EKG to evaluate QTc. May require propofol again if QTc is acceptable. May need continuous NMB rather than pushes. (QTc today 460. Will repeat after propofol challenge. Check triglycerides. 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 Patient hemoglobin A1c 6.2 On linagliptin, detemir and tube feeding coverage insulin  Best practice:  Diet: tube feeding Pain/Anxiety/Delirium protocol (if indicated): as above VAP protocol (if indicated): yes DVT prophylaxis: lovenox  GI prophylaxis: Pantoprazole for stress ulcer prophylaxis Glucose control: SSI Mobility: bed rest Code Status: full Family Communication: will update over the phone Disposition: remain in ICU  Labs   CBC: Recent Labs  Lab 01/18/20 0359 01/18/20 1338 01/19/20 0500 01/19/20 1538 01/20/20 0706 01/20/20 2020 01/21/20 0313 01/21/20 0446 01/22/20 0305 01/22/20 1100  01/23/20 0429  WBC 17.4*  --  15.7*  --  21.1*  --   --  15.7*  --   --  14.0*  NEUTROABS 15.4*  --   --   --  19.9*  --   --  14.3*  --   --   --   HGB 12.8   < > 11.4*   < > 11.7*   < > 11.6* 11.6* 11.9* 13.6 11.8*  HCT  41.6   < > 37.8   < > 39.4   < > 34.0* 38.5 35.0* 40.0 39.7  MCV 95.9  --  95.2  --  95.9  --   --  97.0  --   --  94.7  PLT 191  --  174  --  169  --   --  174  --   --  210   < > = values in this interval not displayed.    Basic Metabolic Panel: Recent Labs  Lab 01/16/20 2142 01/17/20 0259 01/17/20 0500 01/17/20 0500 01/18/20 0359 01/18/20 1338 01/19/20 0500 01/19/20 1538 01/20/20 0706 01/20/20 2020 01/21/20 0446 01/22/20 0305 01/22/20 0341 01/22/20 1100 01/23/20 0429  NA  --    < > 141   < > 142   < > 145   < > 144   < > 144 143 145 141 144  K  --    < > 4.5   < > 4.4   < > 4.8   < > 4.9   < > 4.3 4.4 4.3 4.2 4.3  CL  --   --  109   < > 109  --  109  --  102  --  100  --  103  --  101  CO2  --   --  24   < > 25  --  28  --  34*  --  35*  --  34*  --  33*  GLUCOSE  --   --  228*   < > 165*  --  177*  --  196*  --  115*  --  161*  --  167*  BUN  --   --  23*   < > 28*  --  27*  --  32*  --  30*  --  32*  --  39*  CREATININE  --   --  0.89   < > 0.77  --  0.62  --  0.56  --  0.52  --  0.51  --  0.61  CALCIUM  --   --  8.1*   < > 8.9  --  8.7*  --  8.7*  --  8.8*  --  8.8*  --  8.8*  MG 2.7*  --  2.8*  --  2.6*  --  2.3  --   --   --   --   --  2.1  --   --   PHOS 3.1  --  3.4  --  5.2*  --  4.0  --   --   --   --   --  4.2  --   --    < > = values in this interval not displayed.   GFR: Estimated Creatinine Clearance: 112.2 mL/min (by C-G formula based on SCr of 0.61 mg/dL). Recent Labs  Lab 01/16/20 2142 01/18/20 0359 01/19/20 0500 01/20/20 0706 01/21/20 0446 01/23/20 0429  PROCALCITON 0.15  --   --   --   --   --  WBC  --    < > 15.7* 21.1* 15.7* 14.0*   < > = values in this interval not displayed.    Liver Function Tests: Recent Labs  Lab 01/17/20 0500 01/18/20 0359 01/22/20 0341  AST 37 26 17  ALT 99* 76* 31  ALKPHOS 52 52 44  BILITOT 0.7 0.5 0.7  PROT 5.8* 6.0* 5.8*  ALBUMIN 2.2* 2.3* 2.0*   No results for input(s): LIPASE, AMYLASE in the last 168  hours. No results for input(s): AMMONIA in the last 168 hours.  ABG    Component Value Date/Time   PHART 7.430 01/22/2020 1100   PCO2ART 53.4 (H) 01/22/2020 1100   PO2ART 61 (L) 01/22/2020 1100   HCO3 35.4 (H) 01/22/2020 1100   TCO2 37 (H) 01/22/2020 1100   ACIDBASEDEF 1.0 01/18/2020 1338   O2SAT 91.0 01/22/2020 1100     Coagulation Profile: No results for input(s): INR, PROTIME in the last 168 hours.  Cardiac Enzymes: No results for input(s): CKTOTAL, CKMB, CKMBINDEX, TROPONINI in the last 168 hours.  HbA1C: Hgb A1c MFr Bld  Date/Time Value Ref Range Status  01/03/2020 10:06 PM 6.2 (H) 4.8 - 5.6 % Final    Comment:    (NOTE) Pre diabetes:          5.7%-6.4%  Diabetes:              >6.4%  Glycemic control for   <7.0% adults with diabetes     CBG: Recent Labs  Lab 01/22/20 1620 01/22/20 2009 01/22/20 2327 01/23/20 0431 01/23/20 0837  GLUCAP 124* 140* 185* 171* 164*   The patient is critically ill with multiple organ systems failure and requires high complexity decision making for assessment and support, frequent evaluation and titration of therapies, application of advanced monitoring technologies and extensive interpretation of multiple databases.   Critical Care Time devoted to patient care services described in this note is 40 minutes. This time reflects time of care of this signee Charlott Holler . This critical care time does not reflect separately billable procedures or procedure time, teaching time or supervisory time of PA/NP/Med student/Med Resident etc but could involve care discussion time.  Mickel Baas Pulmonary [facility] Care Medicine 01/23/2020 9:34 AM  Pager: [phone] After hours pager: [phone]

## 2020-01-23 NOTE — Progress Notes (Signed)
Patient's head and arms repositioned at this time without complications.  

## 2020-01-24 ENCOUNTER — Inpatient Hospital Stay (HOSPITAL_COMMUNITY): Payer: BC Managed Care – PPO

## 2020-01-24 LAB — POCT I-STAT 7, (LYTES, BLD GAS, ICA,H+H)
Acid-Base Excess: 6 mmol/L — ABNORMAL HIGH (ref 0.0–2.0)
Acid-Base Excess: 6 mmol/L — ABNORMAL HIGH (ref 0.0–2.0)
Bicarbonate: 30.5 mmol/L — ABNORMAL HIGH (ref 20.0–28.0)
Bicarbonate: 30.3 mmol/L — ABNORMAL HIGH (ref 20.0–28.0)
Calcium, Ion: 1.13 mmol/L — ABNORMAL LOW (ref 1.15–1.40)
Calcium, Ion: 1.12 mmol/L — ABNORMAL LOW (ref 1.15–1.40)
HCT: 32 % — ABNORMAL LOW (ref 36.0–46.0)
HCT: 54 % — ABNORMAL HIGH (ref 36.0–46.0)
Hemoglobin: 10.9 g/dL — ABNORMAL LOW (ref 12.0–15.0)
Hemoglobin: 18.4 g/dL — ABNORMAL HIGH (ref 12.0–15.0)
O2 Saturation: 93 %
O2 Saturation: 94 %
Patient temperature: 98.5
Potassium: 3.8 mmol/L (ref 3.5–5.1)
Potassium: 4.1 mmol/L (ref 3.5–5.1)
Sodium: 143 mmol/L (ref 135–145)
Sodium: 143 mmol/L (ref 135–145)
TCO2: 32 mmol/L (ref 22–32)
TCO2: 32 mmol/L (ref 22–32)
pCO2 arterial: 43.9 mmHg (ref 32.0–48.0)
pCO2 arterial: 40.4 mmHg (ref 32.0–48.0)
pH, Arterial: 7.45 (ref 7.350–7.450)
pH, Arterial: 7.484 — ABNORMAL HIGH (ref 7.350–7.450)
pO2, Arterial: 64 mmHg — ABNORMAL LOW (ref 83.0–108.0)
pO2, Arterial: 64 mmHg — ABNORMAL LOW (ref 83.0–108.0)

## 2020-01-24 LAB — GLUCOSE, CAPILLARY
Glucose-Capillary: 118 mg/dL — ABNORMAL HIGH (ref 70–99)
Glucose-Capillary: 132 mg/dL — ABNORMAL HIGH (ref 70–99)
Glucose-Capillary: 143 mg/dL — ABNORMAL HIGH (ref 70–99)
Glucose-Capillary: 149 mg/dL — ABNORMAL HIGH (ref 70–99)
Glucose-Capillary: 151 mg/dL — ABNORMAL HIGH (ref 70–99)
Glucose-Capillary: 152 mg/dL — ABNORMAL HIGH (ref 70–99)

## 2020-01-24 MED ORDER — POTASSIUM CHLORIDE 20 MEQ PO PACK
40.0000 meq | PACK | Freq: Once | ORAL | Status: DC
  Filled 2020-01-24: qty 2

## 2020-01-24 MED ORDER — POTASSIUM CHLORIDE 20 MEQ PO PACK
40.0000 meq | PACK | Freq: Once | ORAL | Status: AC
  Administered 2020-01-24: 40 meq

## 2020-01-24 MED ORDER — FUROSEMIDE 10 MG/ML IJ SOLN
40.0000 mg | Freq: Once | INTRAMUSCULAR | Status: AC
  Administered 2020-01-24: 40 mg via INTRAVENOUS
  Filled 2020-01-24: qty 4

## 2020-01-24 NOTE — Progress Notes (Signed)
NAME:  Jamie Knapp, MRN:  [id number], DOB:  11-05-1975, LOS: 15 ADMISSION DATE:  01/07/2020, CONSULTATION DATE:  9/18 REFERRING MD:  Gwenlyn Perking, CHIEF COMPLAINT:  Dyspnea   Brief History   44 year old woman with hx of HTN and seizures presenting with COVID ARDS.  Past Medical History  HTN Seizure dx  Significant Hospital Events   9/11 AP ER 9/17 Intubated  Consults:    Procedures:  9/17 ETT>  9/18 L IJ CVL >  9/18 L Radial arterial line >   Significant Diagnostic Tests:    Micro Data:  9/5 SARS COV 2 > positive 9/11 blood > neg 9/12 c diff > neg 9/18 resp culture > negative  Antimicrobials:  Doxycycline 9/13>>9/17 Ceftriaxone 9/13>>9/17 Cefepime  9/18>>9/22 Barcitinib 9/16>>   Interim history/subjective:  Improved oxygenation overnight on propofol and fentanyl. NMB was not started.   Objective   Blood pressure 115/65, pulse 62, temperature 98 F (36.7 C), temperature source Oral, resp. rate (!) 26, height 5\' 7"  (1.702 m), weight 105.3 kg, SpO2 93 %.    Vent Mode: PRVC FiO2 (%):  [40 %-70 %] 40 % Set Rate:  [26 bmp-34 bmp] 26 bmp Vt Set:  [370 mL-430 mL] 430 mL PEEP:  [8 cmH20-12 cmH20] 8 cmH20 Plateau Pressure:  [23 cmH20-29 cmH20] 24 cmH20   Intake/Output Summary (Last 24 hours) at 01/24/2020 1127 Last data filed at 01/24/2020 1000 Gross per 24 hour  Intake 1990.41 ml  Output 4285 ml  Net -2294.59 ml   Filed Weights   01/22/20 0355 01/23/20 0400 01/24/20 0354  Weight: 105.3 kg 105.7 kg 105.3 kg    Examination:   Physical exam: General: Acutely ill-appearing morbidly obese female, orally intubated, currently prone HEAENT: [place]/AT, eyes anicteric.  ETT and OGT in place Neuro: Sedated, not following commands. Chest: Fine crackles heard at the bases bilaterally no wheezes or rhonchi Heart: Regular rate and rhythm, no murmurs or gallops Abdomen: Soft, nontender, nondistended, bowel sounds present Skin: No rash  Resolved Hospital Problem list     Pneumomediastinum  Assessment & Plan:  Acute hypoxemic/hypercapnic respiratory failure due to COVID ARDS Continue mechanical ventilation per ARDS protocol Goal Target Plateau Pressure < 30cm H20 and driving pressure < 15 cm of water Target PaO2 55-80: titrate PEEP/FiO2 per protocol Continue Ventilator associated pneumonia prevention protocol Continue baricitinib Continue Solu-Medrol 40 mg twice daily RASS goal -4/-5, continue Fentanyl and propofol.  IV Lasix 40 mg x 1 today to maintain euvolemia On versed fentanyl and propofol. Given improving P/F ratio today over 150, can stop proning. Will gradually lighten sedation with versed being turned down first.    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 Patient hemoglobin A1c 6.2 On linagliptin, detemir and tube feeding coverage insulin  Best practice:  Diet: tube feeding Pain/Anxiety/Delirium protocol (if indicated): as above VAP protocol (if indicated): yes DVT prophylaxis: lovenox  GI prophylaxis: Pantoprazole for stress ulcer prophylaxis Glucose control: SSI Mobility: bed rest Code Status: full Family Communication: updated mother by phone 9/26 at 11:45am Disposition: remain in ICU  Labs   CBC: Recent Labs  Lab 01/18/20 0359 01/18/20 1338 01/19/20 0500 01/19/20 1538 01/20/20 0706 01/20/20 2020 01/21/20 0446 01/21/20 0446 01/22/20 0305 01/22/20 1100 01/23/20 0429 01/23/20 0945 01/24/20 0824  WBC 17.4*  --  15.7*  --  21.1*  --  15.7*  --   --   --  14.0*  --   --   NEUTROABS 15.4*  --   --   --  19.9*  --  14.3*  --   --   --   --   --   --   HGB 12.8   < > 11.4*   < > 11.7*   < > 11.6*   < > 11.9* 13.6 11.8* 12.2 10.9*  HCT 41.6   < > 37.8   < > 39.4   < > 38.5   < > 35.0* 40.0 39.7 36.0 32.0*  MCV 95.9  --  95.2  --  95.9  --  97.0  --   --   --  94.7   --   --   PLT 191  --  174  --  169  --  174  --   --   --  210  --   --    < > = values in this interval not displayed.    Basic Metabolic Panel: Recent Labs  Lab 01/18/20 0359 01/18/20 1338 01/19/20 0500 01/19/20 1538 01/20/20 0706 01/20/20 2020 01/21/20 0446 01/22/20 0305 01/22/20 0341 01/22/20 1100 01/23/20 0429 01/23/20 0945 01/24/20 0824  NA 142   < > 145   < > 144   < > 144   < > 145 141 144 142 143  K 4.4   < > 4.8   < > 4.9   < > 4.3   < > 4.3 4.2 4.3 4.5 3.8  CL 109  --  109  --  102  --  100  --  103  --  101  --   --   CO2 25  --  28  --  34*  --  35*  --  34*  --  33*  --   --   GLUCOSE 165*  --  177*  --  196*  --  115*  --  161*  --  167*  --   --   BUN 28*  --  27*  --  32*  --  30*  --  32*  --  39*  --   --   CREATININE 0.77  --  0.62  --  0.56  --  0.52  --  0.51  --  0.61  --   --   CALCIUM 8.9  --  8.7*  --  8.7*  --  8.8*  --  8.8*  --  8.8*  --   --   MG 2.6*  --  2.3  --   --   --   --   --  2.1  --  2.2  --   --   PHOS 5.2*  --  4.0  --   --   --   --   --  4.2  --   --   --   --    < > = values in this interval not displayed.   GFR: Estimated Creatinine Clearance: 112.1 mL/min (by C-G formula based on SCr of 0.61 mg/dL). Recent Labs  Lab 01/19/20 0500 01/20/20 0706 01/21/20 0446 01/23/20 0429  WBC 15.7* 21.1* 15.7* 14.0*    Liver Function Tests: Recent Labs  Lab 01/18/20 0359 01/22/20 0341  AST 26 17  ALT 76* 31  ALKPHOS 52 44  BILITOT 0.5 0.7  PROT 6.0* 5.8*  ALBUMIN 2.3* 2.0*   No results for input(s): LIPASE, AMYLASE in the last 168 hours. No  results for input(s): AMMONIA in the last 168 hours.  ABG    Component Value Date/Time   PHART 7.450 01/24/2020 0824   PCO2ART 43.9 01/24/2020 0824   PO2ART 64 (L) 01/24/2020 0824   HCO3 30.5 (H) 01/24/2020 0824   TCO2 32 01/24/2020 0824   ACIDBASEDEF 1.0 01/18/2020 1338   O2SAT 93.0 01/24/2020 0824     Coagulation Profile: No results for input(s): INR, PROTIME in the last 168  hours.  Cardiac Enzymes: No results for input(s): CKTOTAL, CKMB, CKMBINDEX, TROPONINI in the last 168 hours.  HbA1C: Hgb A1c MFr Bld  Date/Time Value Ref Range Status  01/23/2020 10:06 PM 6.2 (H) 4.8 - 5.6 % Final    Comment:    (NOTE) Pre diabetes:          5.7%-6.4%  Diabetes:              >6.4%  Glycemic control for   <7.0% adults with diabetes     CBG: Recent Labs  Lab 01/23/20 1652 01/23/20 2003 01/23/20 2333 01/24/20 0328 01/24/20 0834  GLUCAP 126* 114* 147* 132* 149*   The patient is critically ill with multiple organ systems failure and requires high complexity decision making for assessment and support, frequent evaluation and titration of therapies, application of advanced monitoring technologies and extensive interpretation of multiple databases.   Critical Care Time devoted to patient care services described in this note is 40 minutes. This time reflects time of care of this signee Charlott Holler . This critical care time does not reflect separately billable procedures or procedure time, teaching time or supervisory time of PA/NP/Med student/Med Resident etc but could involve care discussion time.  Mickel Baas [facility] Medicine 01/24/2020 11:27 AM  Pager: [phone] After hours pager: [phone]

## 2020-01-24 NOTE — Progress Notes (Signed)
Patient head & arms rotated. Now lying right side face down. 

## 2020-01-24 NOTE — Progress Notes (Signed)
Patient placed back in supine position & ET tube secured with ET tube holder @ 25 cm.

## 2020-01-24 NOTE — Progress Notes (Signed)
Patient's head and arms repositioned without complications.  

## 2020-01-24 NOTE — Progress Notes (Signed)
Patient's head and arms repositioned at this time without complications.  

## 2020-01-24 NOTE — Progress Notes (Signed)
Assisted tele visit to patient with family member.  Michaila Kenney D Kalev Temme, RN   

## 2020-01-25 LAB — POCT I-STAT 7, (LYTES, BLD GAS, ICA,H+H)
Acid-Base Excess: 8 mmol/L — ABNORMAL HIGH (ref 0.0–2.0)
Acid-Base Excess: 8 mmol/L — ABNORMAL HIGH (ref 0.0–2.0)
Bicarbonate: 36.2 mmol/L — ABNORMAL HIGH (ref 20.0–28.0)
Bicarbonate: 35.1 mmol/L — ABNORMAL HIGH (ref 20.0–28.0)
Calcium, Ion: 1.28 mmol/L (ref 1.15–1.40)
Calcium, Ion: 1.19 mmol/L (ref 1.15–1.40)
HCT: 35 % — ABNORMAL LOW (ref 36.0–46.0)
HCT: 40 % (ref 36.0–46.0)
Hemoglobin: 11.9 g/dL — ABNORMAL LOW (ref 12.0–15.0)
Hemoglobin: 13.6 g/dL (ref 12.0–15.0)
O2 Saturation: 91 %
O2 Saturation: 76 %
Patient temperature: 97.7
Patient temperature: 97.6
Potassium: 4.5 mmol/L (ref 3.5–5.1)
Potassium: 3 mmol/L — ABNORMAL LOW (ref 3.5–5.1)
Sodium: 141 mmol/L (ref 135–145)
Sodium: 143 mmol/L (ref 135–145)
TCO2: 38 mmol/L — ABNORMAL HIGH (ref 22–32)
TCO2: 37 mmol/L — ABNORMAL HIGH (ref 22–32)
pCO2 arterial: 67.1 mmHg (ref 32.0–48.0)
pCO2 arterial: 59.2 mmHg — ABNORMAL HIGH (ref 32.0–48.0)
pH, Arterial: 7.337 — ABNORMAL LOW (ref 7.350–7.450)
pH, Arterial: 7.379 (ref 7.350–7.450)
pO2, Arterial: 65 mmHg — ABNORMAL LOW (ref 83.0–108.0)
pO2, Arterial: 42 mmHg — ABNORMAL LOW (ref 83.0–108.0)

## 2020-01-25 LAB — COMPREHENSIVE METABOLIC PANEL
ALT: 36 U/L (ref 0–44)
AST: 22 U/L (ref 15–41)
Albumin: 2.3 g/dL — ABNORMAL LOW (ref 3.5–5.0)
Alkaline Phosphatase: 53 U/L (ref 38–126)
Anion gap: 5 (ref 5–15)
BUN: 37 mg/dL — ABNORMAL HIGH (ref 6–20)
CO2: 33 mmol/L — ABNORMAL HIGH (ref 22–32)
Calcium: 8.9 mg/dL (ref 8.9–10.3)
Chloride: 105 mmol/L (ref 98–111)
Creatinine, Ser: 0.64 mg/dL (ref 0.44–1.00)
GFR calc Af Amer: >60 mL/min (ref >60)
GFR calc non Af Amer: >60 mL/min (ref >60)
Glucose, Bld: 155 mg/dL — ABNORMAL HIGH (ref 70–99)
Potassium: 4.4 mmol/L (ref 3.5–5.1)
Sodium: 143 mmol/L (ref 135–145)
Total Bilirubin: 0.6 mg/dL (ref 0.3–1.2)
Total Protein: 6.1 g/dL — ABNORMAL LOW (ref 6.5–8.1)

## 2020-01-25 LAB — GLUCOSE, CAPILLARY
Glucose-Capillary: 143 mg/dL — ABNORMAL HIGH (ref 70–99)
Glucose-Capillary: 152 mg/dL — ABNORMAL HIGH (ref 70–99)
Glucose-Capillary: 155 mg/dL — ABNORMAL HIGH (ref 70–99)
Glucose-Capillary: 138 mg/dL — ABNORMAL HIGH (ref 70–99)
Glucose-Capillary: 115 mg/dL — ABNORMAL HIGH (ref 70–99)
Glucose-Capillary: 113 mg/dL — ABNORMAL HIGH (ref 70–99)

## 2020-01-25 LAB — CBC
HCT: 39.7 % (ref 36.0–46.0)
Hemoglobin: 11.8 g/dL — ABNORMAL LOW (ref 12.0–15.0)
MCH: 28.1 pg (ref 26.0–34.0)
MCHC: 29.7 g/dL — ABNORMAL LOW (ref 30.0–36.0)
MCV: 94.5 fL (ref 80.0–100.0)
Platelets: 217 10*3/uL (ref 150–400)
RBC: 4.2 MIL/uL (ref 3.87–5.11)
RDW: 13 % (ref 11.5–15.5)
WBC: 18.8 10*3/uL — ABNORMAL HIGH (ref 4.0–10.5)
nRBC: 0 % (ref 0.0–0.2)

## 2020-01-25 LAB — TRIGLYCERIDES: Triglycerides: 118 mg/dL (ref <150)

## 2020-01-25 MED ORDER — VECURONIUM BROMIDE 10 MG IV SOLR
10.0000 mg | Freq: Once | INTRAVENOUS | Status: AC
  Administered 2020-01-25: 10 mg via INTRAVENOUS

## 2020-01-25 MED ORDER — MIDAZOLAM HCL 2 MG/2ML IJ SOLN
2.0000 mg | INTRAMUSCULAR | Status: DC | PRN
  Administered 2020-01-25 (×2): 2 mg via INTRAVENOUS
  Filled 2020-01-25 (×2): qty 2

## 2020-01-25 MED ORDER — PREDNISONE 20 MG PO TABS: 20.0000 mg | ORAL_TABLET | Freq: Every day | ORAL | Status: DC

## 2020-01-25 MED ORDER — VECURONIUM BROMIDE 10 MG IV SOLR
10.0000 mg | Freq: Four times a day (QID) | INTRAVENOUS | Status: DC | PRN
  Administered 2020-01-25: 10 mg via INTRAVENOUS
  Filled 2020-01-25: qty 10

## 2020-01-25 MED ORDER — SODIUM CHLORIDE 0.9 % IV BOLUS
250.0000 mL | Freq: Once | INTRAVENOUS | Status: AC
  Administered 2020-01-25: 250 mL via INTRAVENOUS

## 2020-01-25 MED ORDER — MIDAZOLAM HCL 2 MG/2ML IJ SOLN
2.0000 mg | INTRAMUSCULAR | Status: DC | PRN
  Administered 2020-01-27: 2 mg via INTRAVENOUS
  Filled 2020-01-25: qty 2

## 2020-01-25 MED ORDER — OXYCODONE HCL 5 MG PO TABS
10.0000 mg | ORAL_TABLET | Freq: Four times a day (QID) | ORAL | Status: DC
  Administered 2020-01-25 (×2): 10 mg via ORAL
  Filled 2020-01-25 (×3): qty 2

## 2020-01-25 MED ORDER — VECURONIUM BROMIDE 10 MG IV SOLR
0.0000 ug/kg/min | INTRAVENOUS | Status: DC
  Administered 2020-01-25 – 2020-01-26 (×3): 1 ug/kg/min via INTRAVENOUS
  Administered 2020-01-27: 0.904 ug/kg/min via INTRAVENOUS
  Filled 2020-01-25 (×6): qty 100

## 2020-01-25 MED ORDER — VECURONIUM BOLUS VIA INFUSION
5.0000 mg | Freq: Once | INTRAVENOUS | Status: AC
  Administered 2020-01-25: 5 mg via INTRAVENOUS
  Filled 2020-01-25: qty 5

## 2020-01-25 MED ORDER — VITAL HIGH PROTEIN PO LIQD
1000.0000 mL | ORAL | Status: DC
  Administered 2020-01-25 – 2020-01-26 (×2): 1000 mL

## 2020-01-25 MED ORDER — ARTIFICIAL TEARS OPHTHALMIC OINT
1.0000 "application " | TOPICAL_OINTMENT | Freq: Three times a day (TID) | OPHTHALMIC | Status: DC
  Administered 2020-01-25 – 2020-01-27 (×8): 1 via OPHTHALMIC
  Filled 2020-01-25 (×2): qty 3.5

## 2020-01-25 MED ORDER — PREDNISONE 20 MG PO TABS
30.0000 mg | ORAL_TABLET | Freq: Two times a day (BID) | ORAL | Status: AC
  Administered 2020-01-25 – 2020-01-26 (×3): 30 mg
  Filled 2020-01-25 (×3): qty 1

## 2020-01-25 MED ORDER — PREDNISONE 20 MG PO TABS
20.0000 mg | ORAL_TABLET | Freq: Two times a day (BID) | ORAL | Status: DC
  Administered 2020-01-27 (×2): 20 mg
  Filled 2020-01-25 (×3): qty 1

## 2020-01-25 MED ORDER — FUROSEMIDE 10 MG/ML IJ SOLN
40.0000 mg | Freq: Four times a day (QID) | INTRAMUSCULAR | Status: DC
  Administered 2020-01-25: 40 mg via INTRAVENOUS
  Filled 2020-01-25: qty 4

## 2020-01-25 MED ORDER — PREDNISONE 10 MG PO TABS: 10.0000 mg | ORAL_TABLET | Freq: Every day | ORAL | Status: DC

## 2020-01-25 NOTE — Progress Notes (Signed)
PCCM progress note   Patient's mother Dasiah Hooley updated over the phone morning of 9/27.  All questions answered.  Delfin Gant, NP-C [facility] Contact / Pager information can be found on Amion  01/25/2020, 11:44 AM

## 2020-01-25 NOTE — Progress Notes (Signed)
Patient's head and arms repositioned at this time without complications.  

## 2020-01-25 NOTE — Progress Notes (Signed)
Prior to turning Pt.'s head while in prone position, Pt. had yellow tube feeding colored material coming from mouth. Tube feedings put on hold and ELink notified.

## 2020-01-25 NOTE — Progress Notes (Signed)
Morgan,RN aware of discontinue CVC order and will remove line.   Penni Bombard Lyra Alaimo,RN-[facility]ST

## 2020-01-25 NOTE — Progress Notes (Signed)
Patient placed in prone position by RT x 1, RN x 3, and NT x 1 without complications.  ETT secured with cloth tape at 25cm in the center.  Foam padding placed on cheeks and upper lip prior to cloth tape.

## 2020-01-25 NOTE — Progress Notes (Addendum)
NAME:  Jamie Knapp, MRN:  [id number], DOB:  06/20/1975, LOS: 16 ADMISSION DATE:  2020-01-31, CONSULTATION DATE:  9/18 REFERRING MD:  Gwenlyn Perking, CHIEF COMPLAINT:  Dyspnea   Brief History   44 year old woman with hx of HTN and seizures presenting with COVID ARDS.  Past Medical History  HTN Seizure dx  Significant Hospital Events   9/11 AP ER 9/17 Intubated  Consults:    Procedures:  9/17 ETT>  9/18 L IJ CVL > 9/27 9/18 L Radial arterial line >  9/22 Right 3L PICC >  Significant Diagnostic Tests:    Micro Data:  9/5 SARS COV 2 > positive 9/11 blood > neg 9/12 c diff > neg 9/18 resp culture > negative  Antimicrobials:  Doxycycline 9/13>>9/17 Ceftriaxone 9/13>>9/17 Cefepime  9/18>>9/22 Barcitinib 9/16>>   Interim history/subjective:  No acute events overnight  Remains supine, sedated on vent  3L net positive   Objective   Blood pressure (!) 100/55, pulse 90, temperature 99.1 F (37.3 C), temperature source Oral, resp. rate (!) 25, height 5\' 7"  (1.702 m), weight 103.3 kg, SpO2 (!) 87 %. CVP:  [10 mmHg-11 mmHg] 11 mmHg  Vent Mode: PRVC FiO2 (%):  [40 %-60 %] 50 % Set Rate:  [20 bmp-34 bmp] 24 bmp Vt Set:  [370 mL-430 mL] 370 mL PEEP:  [8 cmH20-10 cmH20] 8 cmH20 Plateau Pressure:  [24 cmH20-28 cmH20] 25 cmH20   Intake/Output Summary (Last 24 hours) at 01/25/2020 0747 Last data filed at 01/25/2020 0700 Gross per 24 hour  Intake 2810.92 ml  Output 2546 ml  Net 264.92 ml   Filed Weights   01/23/20 0400 01/24/20 0354 01/25/20 0400  Weight: 105.7 kg 105.3 kg 103.3 kg    Examination: General: Well developed acutely ill appearing  adult female lying in bed sedated on vent HEENT: ETT, MM pink/moist, PERRL, Neuro: Sedated on vent  CV: s1s2 regular rate and rhythm, no murmur, rubs, or gallops,  PULM:  Bilateral crackle worse on right, tolerating vent well on propofol and fentanyl GI: soft, bowel sounds active in all 4 quadrants, non-tender, non-distended,  tolerating TF Extremities: warm/dry, no edema  Skin: no rashes or lesions  Resolved Hospital Problem list    Pneumomediastinum  Assessment & Plan:  Acute hypoxemic/hypercapnic respiratory failure due to COVID ARDS -Mets criteria for moderate ARDS with P/F ratio of 130 (32% mortality)  Acute respiratory acidosis, improving -PCO2 40-60 with pH 7.3 P: Continue ventilator support with lung protective strategies  Permissive hypercapnia, goal pH 7.2 or better Wean PEEP and FiO2 for sats greater than 88%, goal PaO2 55-80 Head of bed elevated 30 degrees. Plateau pressures less than 30 cm H20, currently 25 Driving pressure goal less than 15, currently 17 Follow intermittent chest x-ray and ABG Continue Baricitinib and Solu-medrol    Hold SAT/SBT until sedation can be lightened and vent setting decreased   Ensure adequate pulmonary hygiene  Follow cultures  VAP bundle in place  PAD protocol with RASS goal -2, continue to lighten sedation as able Continue Fentanyl and Propofol Continuous NMB stopped 9/25 and Continuous Versed stopped 9/26 Plan to diurese once CMP resulted   Hypertension -Home medications include Lisinopril/HCTZ P: PRN IV Labetalol  Monitor in the ICU setting   History of seizures -Home medications include Keppra 250mg  BID  P: Continue home Keppra IV  Seizure precautions   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 -Home medications include Metfrormin -Patient hemoglobin A1c 6.2 P: Continue Linaglipin, SSI, TF converge, and Levimir  CBG q4hrs  Home Metformin on hold   At risk malnutrition P: Continue TF Bowel regiment   Best practice:  Diet: tube feeding Pain/Anxiety/Delirium protocol (if indicated): as above VAP protocol (if indicated): yes DVT prophylaxis: lovenox  GI prophylaxis: Pantoprazole for stress ulcer prophylaxis Glucose control: SSI Mobility: bed rest Code Status: full Family Communication: Will update mother again today by  phone Disposition: remain in ICU  Labs   CBC: Recent Labs  Lab 01/19/20 0500 01/19/20 1538 01/20/20 0706 01/20/20 2020 01/21/20 0446 01/22/20 0305 01/23/20 0429 01/23/20 0945 01/24/20 0824 01/24/20 1528 01/25/20 0117  WBC 15.7*  --  21.1*  --  15.7*  --  14.0*  --   --   --   --   NEUTROABS  --   --  19.9*  --  14.3*  --   --   --   --   --   --   HGB 11.4*   < > 11.7*   < > 11.6*   < > 11.8* 12.2 10.9* 18.4* 11.9*  HCT 37.8   < > 39.4   < > 38.5   < > 39.7 36.0 32.0* 54.0* 35.0*  MCV 95.2  --  95.9  --  97.0  --  94.7  --   --   --   --   PLT 174  --  169  --  174  --  210  --   --   --   --    < > = values in this interval not displayed.    Basic Metabolic Panel: Recent Labs  Lab 01/19/20 0500 01/19/20 1538 01/20/20 0706 01/20/20 2020 01/21/20 0446 01/22/20 0305 01/22/20 0341 01/22/20 1100 01/23/20 0429 01/23/20 0945 01/24/20 0824 01/24/20 1528 01/25/20 0117  NA 145   < > 144   < > 144   < > 145   < > 144 142 143 143 141  K 4.8   < > 4.9   < > 4.3   < > 4.3   < > 4.3 4.5 3.8 4.1 4.5  CL 109  --  102  --  100  --  103  --  101  --   --   --   --   CO2 28  --  34*  --  35*  --  34*  --  33*  --   --   --   --   GLUCOSE 177*  --  196*  --  115*  --  161*  --  167*  --   --   --   --   BUN 27*  --  32*  --  30*  --  32*  --  39*  --   --   --   --   CREATININE 0.62  --  0.56  --  0.52  --  0.51  --  0.61  --   --   --   --   CALCIUM 8.7*  --  8.7*  --  8.8*  --  8.8*  --  8.8*  --   --   --   --   MG 2.3  --   --   --   --   --  2.1  --  2.2  --   --   --   --   PHOS 4.0  --   --   --   --   --  4.2  --   --   --   --   --   --    < > =  values in this interval not displayed.   GFR: Estimated Creatinine Clearance: 110.9 mL/min (by C-G formula based on SCr of 0.61 mg/dL). Recent Labs  Lab 01/19/20 0500 01/20/20 0706 01/21/20 0446 01/23/20 0429  WBC 15.7* 21.1* 15.7* 14.0*    Liver Function Tests: Recent Labs  Lab 01/22/20 0341  AST 17  ALT 31   ALKPHOS 44  BILITOT 0.7  PROT 5.8*  ALBUMIN 2.0*   No results for input(s): LIPASE, AMYLASE in the last 168 hours. No results for input(s): AMMONIA in the last 168 hours.  ABG    Component Value Date/Time   PHART 7.337 (L) 01/25/2020 0117   PCO2ART 67.1 (HH) 01/25/2020 0117   PO2ART 65 (L) 01/25/2020 0117   HCO3 36.2 (H) 01/25/2020 0117   TCO2 38 (H) 01/25/2020 0117   ACIDBASEDEF 1.0 01/18/2020 1338   O2SAT 91.0 01/25/2020 0117     Coagulation Profile: No results for input(s): INR, PROTIME in the last 168 hours.  Cardiac Enzymes: No results for input(s): CKTOTAL, CKMB, CKMBINDEX, TROPONINI in the last 168 hours.  HbA1C: Hgb A1c MFr Bld  Date/Time Value Ref Range Status  01/13/2020 10:06 PM 6.2 (H) 4.8 - 5.6 % Final    Comment:    (NOTE) Pre diabetes:          5.7%-6.4%  Diabetes:              >6.4%  Glycemic control for   <7.0% adults with diabetes     CBG: Recent Labs  Lab 01/24/20 1237 01/24/20 1616 01/24/20 2015 01/24/20 2332 01/25/20 0346  GLUCAP 143* 118* 151* 152* 143*   CRITICAL CARE Performed by: Delfin Gant  Total critical care time: 42 minutes  Critical care time was exclusive of separately billable procedures and treating other patients.  Critical care was necessary to treat or prevent imminent or life-threatening deterioration.  Critical care was time spent personally by me on the following activities: development of treatment plan with patient and/or surrogate as well as nursing, discussions with consultants, evaluation of patient's response to treatment, examination of patient, obtaining history from patient or surrogate, ordering and performing treatments and interventions, ordering and review of laboratory studies, ordering and review of radiographic studies, pulse oximetry and re-evaluation of patient's condition.  Delfin Gant, NP-C [facility] Contact / Pager information can be found on Amion   01/25/2020, 8:08 AM

## 2020-01-25 NOTE — Progress Notes (Signed)
Arterial line not working, can not pull back blood, dampened wave form, very positional.  Arterial line assessed to see if it could be saved without success.  Arterial line removed. RN notified.

## 2020-01-25 NOTE — Progress Notes (Signed)
Nutrition Follow-up  DOCUMENTATION CODES:   Not applicable  INTERVENTION:   Tube Feeding via Cortrak tube: Vital High Protein at 65 ml/hr Provides 1560 kcals, 137 g of protein and 1310 mL of free water  TF regimen and propofol at current rate providing 2396 total kcal/day    NUTRITION DIAGNOSIS:   Inadequate oral intake related to acute illness as evidenced by NPO status.  Ongoing.   GOAL:   Patient will meet greater than or equal to 90% of their needs  Met.   MONITOR:   Vent status, Labs, Weight trends, TF tolerance  REASON FOR ASSESSMENT:   Consult, Ventilator Enteral/tube feeding initiation and management  ASSESSMENT:   44 yo female admitted with COVID+ ARDS requiring intubation on 9/28. PMH includes HTN, seizures  Spoke with RN. Pt on high rate of propofol due to vent dyssynchrony. Per RN she has had to go up on her rates. Per MD may need trach if unable to extubate.   9/27 cortrak placed; tip gastric   Patient is currently intubated on ventilator support MV: 9.5 L/min Temp (24hrs), Avg:97.4 F (36.3 C), Min:94.1 F (34.5 C), Max:99.1 F (37.3 C)  Propofol: 31.7 ml/hr provides: 836 kcal  Medications reviewed and include: 500 mg vitamin C daily, baricitnib, colace, lasix, SSI, 5 units novolog every 4 hours, 15 units levemir BID, tradjenta, xycodone, prednisone, zinc  Fentanyl  Labs reviewed:  CBG's: 155-138    NUTRITION - FOCUSED PHYSICAL EXAM:    Most Recent Value  Orbital Region No depletion  Upper Arm Region No depletion  Thoracic and Lumbar Region No depletion  Buccal Region No depletion  Temple Region No depletion  Clavicle Bone Region No depletion  Clavicle and Acromion Bone Region No depletion  Scapular Bone Region No depletion  Dorsal Hand No depletion  Patellar Region No depletion  Anterior Thigh Region No depletion  Posterior Calf Region No depletion  Edema (RD Assessment) Mild  Hair Reviewed  Eyes Unable to assess  Mouth Unable  to assess  Skin Reviewed  Nails Reviewed       Diet Order:   Diet Order            Diet NPO time specified  Diet effective now                 EDUCATION NEEDS:   Not appropriate for education at this time  Skin:  Skin Assessment: Skin Integrity Issues: Skin Integrity Issues:: Stage II Stage II: nose  Last BM:  9/26 x 2 via rectal tube  Height:   Ht Readings from Last 1 Encounters:  01/25/20 5' 7" (1.702 m)    Weight:   Wt Readings from Last 1 Encounters:  01/25/20 103.3 kg    Ideal Body Weight:     BMI:  Body mass index is 35.67 kg/m.  Estimated Nutritional Needs:   Kcal:  2110-2530 kcals  Protein:  125-145 g  Fluid:  >/= 2 L   P., RD, LDN, CNSC See AMiON for contact information   

## 2020-01-25 NOTE — Procedures (Signed)
Cortrak  Person Inserting Tube:  Amdrew Oboyle, RD Tube Type:  Cortrak - 43 inches Tube Location:  Right nare Initial Placement:  Stomach Secured by: Bridle Technique Used to Measure Tube Placement:  Documented cm marking at nare/ corner of mouth Cortrak Secured At:  80 cm   No x-ray is required. RN may begin using tube.   If the tube becomes dislodged please keep the tube and contact the Cortrak team at [email or web address] (password [email or web address]) for replacement.  If after hours and replacement cannot be delayed, place a NG tube and confirm placement with an abdominal x-ray.    Vanessa Kick RD, LDN Clinical Nutrition Pager listed in AMION

## 2020-01-26 ENCOUNTER — Inpatient Hospital Stay (HOSPITAL_COMMUNITY): Payer: BC Managed Care – PPO

## 2020-01-26 LAB — BASIC METABOLIC PANEL
Anion gap: 11 (ref 5–15)
BUN: 39 mg/dL — ABNORMAL HIGH (ref 6–20)
CO2: 32 mmol/L (ref 22–32)
Calcium: 8.6 mg/dL — ABNORMAL LOW (ref 8.9–10.3)
Chloride: 100 mmol/L (ref 98–111)
Creatinine, Ser: 0.68 mg/dL (ref 0.44–1.00)
GFR calc Af Amer: >60 mL/min (ref >60)
GFR calc non Af Amer: >60 mL/min (ref >60)
Glucose, Bld: 123 mg/dL — ABNORMAL HIGH (ref 70–99)
Potassium: 3.7 mmol/L (ref 3.5–5.1)
Sodium: 143 mmol/L (ref 135–145)

## 2020-01-26 LAB — CBC
HCT: 40.9 % (ref 36.0–46.0)
Hemoglobin: 12.1 g/dL (ref 12.0–15.0)
MCH: 28.7 pg (ref 26.0–34.0)
MCHC: 29.6 g/dL — ABNORMAL LOW (ref 30.0–36.0)
MCV: 96.9 fL (ref 80.0–100.0)
Platelets: 189 10*3/uL (ref 150–400)
RBC: 4.22 MIL/uL (ref 3.87–5.11)
RDW: 13.3 % (ref 11.5–15.5)
WBC: 14.7 10*3/uL — ABNORMAL HIGH (ref 4.0–10.5)
nRBC: 0 % (ref 0.0–0.2)

## 2020-01-26 LAB — GLUCOSE, CAPILLARY
Glucose-Capillary: 103 mg/dL — ABNORMAL HIGH (ref 70–99)
Glucose-Capillary: 87 mg/dL (ref 70–99)
Glucose-Capillary: 89 mg/dL (ref 70–99)
Glucose-Capillary: 113 mg/dL — ABNORMAL HIGH (ref 70–99)
Glucose-Capillary: 169 mg/dL — ABNORMAL HIGH (ref 70–99)

## 2020-01-26 MED ORDER — FUROSEMIDE 10 MG/ML IJ SOLN
40.0000 mg | Freq: Once | INTRAMUSCULAR | Status: AC
  Administered 2020-01-26: 40 mg via INTRAVENOUS
  Filled 2020-01-26: qty 4

## 2020-01-26 MED ORDER — NOREPINEPHRINE 4 MG/250ML-% IV SOLN
0.0000 ug/min | INTRAVENOUS | Status: DC
  Administered 2020-01-26: 3 ug/min via INTRAVENOUS
  Filled 2020-01-26: qty 250

## 2020-01-26 MED ORDER — OXYCODONE HCL 5 MG PO TABS
10.0000 mg | ORAL_TABLET | Freq: Four times a day (QID) | ORAL | Status: DC
  Administered 2020-01-26 – 2020-01-27 (×7): 10 mg
  Filled 2020-01-26 (×6): qty 2

## 2020-01-26 MED ORDER — LIP MEDEX EX OINT
TOPICAL_OINTMENT | CUTANEOUS | Status: DC | PRN
  Filled 2020-01-26 (×2): qty 7

## 2020-01-26 MED ORDER — POLYETHYLENE GLYCOL 3350 17 G PO PACK: 17.0000 g | PACK | Freq: Every day | ORAL | Status: DC | PRN

## 2020-01-26 NOTE — Progress Notes (Signed)
I have updated patient's mother by phone. We reviewed her current vitals and imaging, vent settings and treatment plan for the day. All questions were asked and answered. The family had just had an Ipad visit with the nursing staff.

## 2020-01-26 NOTE — Progress Notes (Signed)
Patient's head and arms repositioned at this time without complications.  

## 2020-01-26 NOTE — Progress Notes (Addendum)
NAME:  Jamie Knapp, MRN:  [id number], DOB:  1975-07-21, LOS: 17 ADMISSION DATE:  Feb 04, 2020, CONSULTATION DATE:  9/18 REFERRING MD:  Gwenlyn Perking, CHIEF COMPLAINT:  Dyspnea   Brief History   44 year old woman with hx of HTN and seizures presenting with COVID ARDS.  Past Medical History  HTN Seizure dx  Significant Hospital Events   9/11 AP ER 9/17 Intubated  Consults:    Procedures:  9/17 ETT>  9/18 L IJ CVL > 9/27 9/18 L Radial arterial line >  9/22 Right 3L PICC >  Significant Diagnostic Tests:    Micro Data:  9/5 SARS COV 2 > positive 9/11 blood > neg 9/12 c diff > neg 9/18 resp culture > negative  Antimicrobials:  Doxycycline 9/13>>9/17 Ceftriaxone 9/13>>9/17 Cefepime  9/18>>9/22 Barcitinib 9/16>>   Interim history/subjective:  Needed proning overnight 9/27  sedated on vent  6 L net positive  Plateau pressure 30; PEEP 10, Driving Pressure 20, thick yellow to tan secretions and coarse WBC 14.7, HGB 12.1 Vomited a large amount of TF overnight. TF currently on hold until turned supine at 12 noon Concern for aspiration T max 98.9 WBC 14.7 250 cc bolus overnight for hypotension   Objective   Blood pressure (!) 99/59, pulse 94, temperature 99 F (37.2 C), temperature source Oral, resp. rate (!) 24, height 5\' 7"  (1.702 m), weight 106.3 kg, SpO2 94 %. CVP:  [8 mmHg] 8 mmHg  Vent Mode: PRVC FiO2 (%):  [60 %-100 %] 100 % Set Rate:  [24 bmp] 24 bmp Vt Set:  [370 mL] 370 mL PEEP:  [8 cmH20-10 cmH20] 10 cmH20 Plateau Pressure:  [20 cmH20-32 cmH20] 30 cmH20   Intake/Output Summary (Last 24 hours) at 01/26/2020 0930 Last data filed at 01/26/2020 0900 Gross per 24 hour  Intake 3718.33 ml  Output 1367 ml  Net 2351.33 ml   Filed Weights   01/24/20 0354 01/25/20 0400 01/26/20 0600  Weight: 105.3 kg 103.3 kg 106.3 kg    Examination: General: Well developed acutely ill appearing  adult female prone  in bed sedated and paralyzed  on vent HEENT: ETT, MM  pink/moist, PERRL, Neuro: Sedated and paralyzed  on vent  CV: s1s2 regular rate and rhythm, no murmur, rubs, or gallops,  PULM:  Bilateral crackles, coarse, tolerating vent well on propofol and fentanyl and vec, yellow tan secretions GI: soft, bowel sounds active in all 4 quadrants, non-tender, non-distended, TF off as patient vomited  Extremities: warm/dry, no edema  Skin: no rashes or lesions  Resolved Hospital Problem list    Pneumomediastinum  Assessment & Plan:  Acute hypoxemic/hypercapnic respiratory failure due to COVID ARDS -Mets criteria for moderate ARDS with P/F ratio of 42 (32% mortality)  Acute respiratory acidosis, improving -PCO2 40-60 with pH 7.37 P/F ratio dropped overnight 9/18 P: Continue ventilator support with lung protective strategies  Permissive hypercapnia, goal pH 7.2 or better Wean PEEP and FiO2 for sats greater than 88%, goal PaO2 55-80 Head of bed elevated 30 degrees. Plateau pressures less than 30 cm H20, currently 25 Driving pressure goal less than 15, currently 20 Follow intermittent chest x-ray and ABG Continue Baricitinib and Solu-medrol    Hold SAT/SBT until sedation can be lightened and vent setting decreased   Ensure adequate pulmonary hygiene  Follow cultures  VAP bundle in place  PAD protocol with RASS goal -2, continue to lighten sedation as able Continue Fentanyl and Propofol Continuous NMB resumed 9/27 and Continuous Versed stopped 9/26 Lasix 40  x 1 despite soft BP, will add pressor if need for hypotension  ? 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 -Home medications  include Metfrormin -Patient hemoglobin A1c 6.2 P: Continue Linaglipin, SSI, TF converge, and Levimir  CBG q4hrs  Home Metformin on hold  Will need to monitor blood sugar carefully while TF are off  At risk malnutrition Vomited large amounts of TF 9/28 P: TF on hold until 12 noon when supine CXR at 12 noon when supine KUB at 12 noon when supine  Bowel regiment   Best practice:  Diet: tube feeding Pain/Anxiety/Delirium protocol (if indicated): as above VAP protocol (if indicated): yes DVT prophylaxis: lovenox  GI prophylaxis: Pantoprazole for stress ulcer prophylaxis Glucose control: SSI Mobility: bed rest Code Status: full Family Communication: Will update mother again today by phone Disposition: remain in ICU  Labs   CBC: Recent Labs  Lab 01/20/20 0706 01/20/20 2020 01/21/20 0446 01/22/20 0305 01/23/20 0429 01/23/20 0945 01/24/20 1528 01/25/20 0117 01/25/20 0754 01/25/20 1616 01/26/20 0500  WBC 21.1*  --  15.7*  --  14.0*  --   --   --  18.8*  --  14.7*  NEUTROABS 19.9*  --  14.3*  --   --   --   --   --   --   --   --   HGB 11.7*   < > 11.6*   < > 11.8*   < > 18.4* 11.9* 11.8* 13.6 12.1  HCT 39.4   < > 38.5   < > 39.7   < > 54.0* 35.0* 39.7 40.0 40.9  MCV 95.9  --  97.0  --  94.7  --   --   --  94.5  --  96.9  PLT 169  --  174  --  210  --   --   --  217  --  189   < > = values in this interval not displayed.    Basic Metabolic Panel: Recent Labs  Lab 01/21/20 0446 01/22/20 0305 01/22/20 0341 01/22/20 1100 01/23/20 0429 01/23/20 0945 01/24/20 1528 01/25/20 0117 01/25/20 0754 01/25/20 1616 01/26/20 0500  NA 144   < > 145   < > 144   < > 143 141 143 143 143  K 4.3   < > 4.3   < > 4.3   < > 4.1 4.5 4.4 3.0* 3.7  CL 100  --  103  --  101  --   --   --  105  --  100  CO2 35*  --  34*  --  33*  --   --   --  33*  --  32  GLUCOSE 115*  --  161*  --  167*  --   --   --  155*  --  123*  BUN 30*  --  32*  --  39*  --   --   --  37*  --  39*  CREATININE  0.52  --  0.51  --  0.61  --   --   --  0.64  --  0.68  CALCIUM 8.8*  --  8.8*  --  8.8*  --   --   --  8.9  --  8.6*  MG  --   --  2.1  --  2.2  --   --   --   --   --   --   PHOS  --   --  4.2  --   --   --   --   --   --   --   --    < > = values in this interval not displayed.   GFR: Estimated Creatinine Clearance: 112.6 mL/min (by C-G formula based on SCr of 0.68 mg/dL). Recent Labs  Lab 01/21/20 0446 01/23/20 0429 01/25/20 0754 01/26/20 0500  WBC 15.7* 14.0* 18.8* 14.7*    Liver Function Tests: Recent Labs  Lab 01/22/20 0341 01/25/20 0754  AST 17 22  ALT 31 36  ALKPHOS 44 53  BILITOT 0.7 0.6  PROT 5.8* 6.1*  ALBUMIN 2.0* 2.3*   No results for input(s): LIPASE, AMYLASE in the last 168 hours. No results for input(s): AMMONIA in the last 168 hours.  ABG    Component Value Date/Time   PHART 7.379 01/25/2020 1616   PCO2ART 59.2 (H) 01/25/2020 1616   PO2ART 42 (L) 01/25/2020 1616   HCO3 35.1 (H) 01/25/2020 1616   TCO2 37 (H) 01/25/2020 1616   ACIDBASEDEF 1.0 01/18/2020 1338   O2SAT 76.0 01/25/2020 1616     Coagulation Profile: No results for input(s): INR, PROTIME in the last 168 hours.  Cardiac Enzymes: No results for input(s): CKTOTAL, CKMB, CKMBINDEX, TROPONINI in the last 168 hours.  HbA1C: Hgb A1c MFr Bld  Date/Time Value Ref Range Status  25-Jan-2020 10:06 PM 6.2 (H) 4.8 - 5.6 % Final    Comment:    (NOTE) Pre diabetes:          5.7%-6.4%  Diabetes:              >6.4%  Glycemic control for   <7.0% adults with diabetes     CBG: Recent Labs  Lab 01/25/20 1534 01/25/20 1953 01/25/20 2324 01/26/20 0352 01/26/20 0804  GLUCAP 138* 115* 113* 103* 87   CRITICAL CARE Performed by: Bevelyn Ngo  Total critical care time: 37 minutes  Critical care time was exclusive of separately billable procedures and treating other patients.  Critical care was necessary to treat or prevent imminent or life-threatening deterioration.  Critical care was  time spent personally by me on the following activities: development of treatment plan with patient and/or surrogate as well as nursing, discussions with consultants, evaluation of patient's response to treatment, examination of patient, obtaining history from patient or surrogate, ordering and performing treatments and interventions, ordering and review of laboratory studies, ordering and review of radiographic studies, pulse oximetry and re-evaluation of patient's condition.   Bevelyn Ngo, MSN, AGACNP-BC [facility] See Amion for personal pager PCCM on call pager [phone] 01/26/2020, 9:30 AM

## 2020-01-26 NOTE — Progress Notes (Signed)
Patient placed in supine position by RT x 2 and RN x 4 without complications.   

## 2020-01-26 NOTE — Progress Notes (Signed)
Patient placed in the prone position at this time with x1 NT, x2 RT, and x3 RN. Patient's ETT secured in the proper position with cloth tape. No complications.

## 2020-01-26 NOTE — Progress Notes (Signed)
RT attempted to draw a supine ABG without success.  Dr. Celine Mans notified and stated we could use the pulse ox instead of an ABG.

## 2020-01-27 ENCOUNTER — Inpatient Hospital Stay (HOSPITAL_COMMUNITY): Payer: BC Managed Care – PPO

## 2020-01-27 DIAGNOSIS — J80 Acute respiratory distress syndrome: Secondary | ICD-10-CM

## 2020-01-27 DIAGNOSIS — R578 Other shock: Secondary | ICD-10-CM

## 2020-01-27 DIAGNOSIS — R0902 Hypoxemia: Secondary | ICD-10-CM

## 2020-01-27 LAB — BLOOD GAS, ARTERIAL
Acid-base deficit: 3.4 mmol/L — ABNORMAL HIGH (ref 0.0–2.0)
Bicarbonate: 26.5 mmol/L (ref 20.0–28.0)
Drawn by: 441371
FIO2: 100
O2 Saturation: 80.4 %
Patient temperature: 36.5
pCO2 arterial: 101 mmHg (ref 32.0–48.0)
pH, Arterial: 7.042 — CL (ref 7.350–7.450)
pO2, Arterial: 52.5 mmHg — ABNORMAL LOW (ref 83.0–108.0)

## 2020-01-27 LAB — CBC
HCT: 37 % (ref 36.0–46.0)
Hemoglobin: 11.5 g/dL — ABNORMAL LOW (ref 12.0–15.0)
MCH: 30.9 pg (ref 26.0–34.0)
MCHC: 31.1 g/dL (ref 30.0–36.0)
MCV: 99.5 fL (ref 80.0–100.0)
Platelets: 167 10*3/uL (ref 150–400)
RBC: 3.72 MIL/uL — ABNORMAL LOW (ref 3.87–5.11)
RDW: 13.4 % (ref 11.5–15.5)
WBC: 4 10*3/uL (ref 4.0–10.5)
nRBC: 0 % (ref 0.0–0.2)

## 2020-01-27 LAB — BASIC METABOLIC PANEL
Anion gap: 8 (ref 5–15)
BUN: 30 mg/dL — ABNORMAL HIGH (ref 6–20)
CO2: 30 mmol/L (ref 22–32)
Calcium: 7.5 mg/dL — ABNORMAL LOW (ref 8.9–10.3)
Chloride: 99 mmol/L (ref 98–111)
Creatinine, Ser: 0.74 mg/dL (ref 0.44–1.00)
GFR calc Af Amer: >60 mL/min (ref >60)
GFR calc non Af Amer: >60 mL/min (ref >60)
Glucose, Bld: 102 mg/dL — ABNORMAL HIGH (ref 70–99)
Potassium: 3.7 mmol/L (ref 3.5–5.1)
Sodium: 137 mmol/L (ref 135–145)

## 2020-01-27 LAB — ECHOCARDIOGRAM LIMITED
Area-P 1/2: 3.48 cm2
Calc EF: 55.5 %
Height: 67 in
Single Plane A2C EF: 55.9 %
Single Plane A4C EF: 53.3 %
Weight: 3749.58 oz

## 2020-01-27 LAB — GLUCOSE, CAPILLARY
Glucose-Capillary: 105 mg/dL — ABNORMAL HIGH (ref 70–99)
Glucose-Capillary: 105 mg/dL — ABNORMAL HIGH (ref 70–99)
Glucose-Capillary: 110 mg/dL — ABNORMAL HIGH (ref 70–99)
Glucose-Capillary: 117 mg/dL — ABNORMAL HIGH (ref 70–99)
Glucose-Capillary: 144 mg/dL — ABNORMAL HIGH (ref 70–99)
Glucose-Capillary: 53 mg/dL — ABNORMAL LOW (ref 70–99)
Glucose-Capillary: 76 mg/dL (ref 70–99)

## 2020-01-27 LAB — MAGNESIUM: Magnesium: 1.7 mg/dL (ref 1.7–2.4)

## 2020-01-27 MED ORDER — PHENYLEPHRINE HCL-NACL 20-0.9 MG/250ML-% IV SOLN
0.0000 ug/min | INTRAVENOUS | Status: DC
  Administered 2020-01-27: 400 ug/min via INTRAVENOUS
  Filled 2020-01-27 (×2): qty 250

## 2020-01-27 MED ORDER — EPINEPHRINE HCL 5 MG/250ML IV SOLN IN NS
INTRAVENOUS | Status: AC
  Filled 2020-01-27: qty 250

## 2020-01-27 MED ORDER — MAGNESIUM SULFATE 2 GM/50ML IV SOLN
2.0000 g | Freq: Once | INTRAVENOUS | Status: AC
  Administered 2020-01-27: 2 g via INTRAVENOUS
  Filled 2020-01-27: qty 50

## 2020-01-27 MED ORDER — VASOPRESSIN 20 UNITS/100 ML INFUSION FOR SHOCK
0.0000 [IU]/min | INTRAVENOUS | Status: DC
  Administered 2020-01-27 (×2): 0.03 [IU]/min via INTRAVENOUS
  Filled 2020-01-27 (×2): qty 100

## 2020-01-27 MED ORDER — SODIUM BICARBONATE 8.4 % IV SOLN
INTRAVENOUS | Status: AC
  Filled 2020-01-27: qty 100

## 2020-01-27 MED ORDER — PHENYLEPHRINE HCL-NACL 10-0.9 MG/250ML-% IV SOLN
0.0000 ug/min | INTRAVENOUS | Status: DC
  Administered 2020-01-27: 350 ug/min via INTRAVENOUS
  Administered 2020-01-27: 20 ug/min via INTRAVENOUS
  Filled 2020-01-27 (×3): qty 250

## 2020-01-27 MED ORDER — POTASSIUM CHLORIDE 10 MEQ/50ML IV SOLN
10.0000 meq | INTRAVENOUS | Status: AC
  Administered 2020-01-27 (×2): 10 meq via INTRAVENOUS
  Filled 2020-01-27 (×2): qty 50

## 2020-01-27 MED ORDER — SODIUM BICARBONATE 8.4 % IV SOLN
INTRAVENOUS | Status: DC
  Filled 2020-01-27: qty 850

## 2020-01-27 MED ORDER — DEXTROSE 50 % IV SOLN
INTRAVENOUS | Status: AC
  Administered 2020-01-27: 50 mL
  Filled 2020-01-27: qty 50

## 2020-01-27 MED ORDER — PHENYLEPHRINE CONCENTRATED 100MG/250ML (0.4 MG/ML) INFUSION SIMPLE
0.0000 ug/min | INTRAVENOUS | Status: DC
  Administered 2020-01-27: 400 ug/min via INTRAVENOUS
  Filled 2020-01-27 (×3): qty 250

## 2020-01-27 MED ORDER — EPINEPHRINE 0.1 MG/10ML (10 MCG/ML) SYRINGE FOR IV PUSH (FOR BLOOD PRESSURE SUPPORT)
5.0000 ug | PREFILLED_SYRINGE | Freq: Once | INTRAVENOUS | Status: DC | PRN
  Filled 2020-01-27 (×2): qty 10

## 2020-01-27 MED ORDER — INSULIN DETEMIR 100 UNIT/ML ~~LOC~~ SOLN
10.0000 [IU] | Freq: Two times a day (BID) | SUBCUTANEOUS | Status: DC
  Filled 2020-01-27 (×2): qty 0.1

## 2020-01-27 MED ORDER — EPINEPHRINE HCL 5 MG/250ML IV SOLN IN NS
0.5000 ug/min | INTRAVENOUS | Status: DC
  Administered 2020-01-27: 21:00:00 4 ug/min via INTRAVENOUS

## 2020-01-27 MED ORDER — POTASSIUM CHLORIDE 10 MEQ/50ML IV SOLN
10.0000 meq | INTRAVENOUS | Status: AC
  Administered 2020-01-27: 10 meq via INTRAVENOUS
  Filled 2020-01-27: qty 50

## 2020-01-27 MED ORDER — SODIUM BICARBONATE 8.4 % IV SOLN
100.0000 meq | Freq: Once | INTRAVENOUS | Status: AC
  Administered 2020-01-27: 100 meq via INTRAVENOUS
  Filled 2020-01-27: qty 100

## 2020-01-27 MED ORDER — NOREPINEPHRINE 16 MG/250ML-% IV SOLN
0.0000 ug/min | INTRAVENOUS | Status: DC
  Administered 2020-01-27: 24 ug/min via INTRAVENOUS
  Administered 2020-01-27 (×3): 70 ug/min via INTRAVENOUS
  Filled 2020-01-27 (×4): qty 250

## 2020-01-27 MED ORDER — PANTOPRAZOLE SODIUM 40 MG IV SOLR
40.0000 mg | Freq: Two times a day (BID) | INTRAVENOUS | Status: DC
  Administered 2020-01-27 (×2): 40 mg via INTRAVENOUS
  Filled 2020-01-27 (×2): qty 40

## 2020-01-27 MED ORDER — SODIUM BICARBONATE 8.4 % IV SOLN
100.0000 meq | Freq: Once | INTRAVENOUS | Status: AC
  Administered 2020-01-27: 100 meq via INTRAVENOUS

## 2020-01-27 NOTE — Procedures (Signed)
Cortrak  Person Inserting Tube:  Felix Pacini E, RD Tube Type:  Cortrak - 43 inches Tube Location:  Right nare Initial Placement:  Stomach Secured by: Bridle Technique Used to Measure Tube Placement:  Documented cm marking at nare/ corner of mouth Cortrak Secured At:  81 cm    Cortrak Tube Team Note:  Patient has Cortrak tube that was placed on 9/27. Received request to attempt to advance Cortrak tube post-pyloric. Found tube secured in right nare at 80 cm. Attempted to advance post-pyloric but attempts were unsuccessful. Secured tube back in right nare at 81 cm. Tube still terminates in stomach.  No x-ray is required. RN may begin using tube.   If the tube becomes dislodged please keep the tube and contact the Cortrak team at [email or web address] ([email or web address]) for replacement.  If after hours and replacement cannot be delayed, place a NG tube and confirm placement with an abdominal x-ray.   Felix Pacini, MS, RD, LDN Pager number available on Amion

## 2020-01-27 NOTE — Progress Notes (Signed)
Assisted tele visit to patient with mother Aram Beecham.  Vena Austria, RN

## 2020-01-27 NOTE — Progress Notes (Addendum)
NAME:  Alizza Sacra, MRN:  [id number], DOB:  1975-10-11, LOS: 18 ADMISSION DATE:  02/06/20, CONSULTATION DATE:  9/18 REFERRING MD:  Gwenlyn Perking, CHIEF COMPLAINT:  Dyspnea   Brief History   44 year old woman with hx of HTN and seizures presenting with COVID ARDS.  Past Medical History  HTN Seizure dx  Significant Hospital Events   9/11 [facility] ER 9/17 Intubated  Consults:    Procedures:  9/17 ETT>  9/18 L IJ CVL > 9/27 9/18 L Radial arterial line >  9/22 Right 3L PICC >  Significant Diagnostic Tests:    Micro Data:  9/5 SARS COV 2 > positive 9/11 blood > neg 9/12 c diff > neg 9/18 resp culture > negative  Antimicrobials:  Doxycycline 9/13>>9/17 Ceftriaxone 9/13>>9/17 Cefepime  9/18>>9/22 Barcitinib 9/16>>   Interim history/subjective:  Worsening desaturations overnight with position changes. PEEP increased to 16. Sats to 70s and did not recover. Hypotensive overnight requiring norepinephrine.   Objective   Blood pressure (!) 92/55, pulse (!) 113, temperature 98.4 F (36.9 C), temperature source Axillary, resp. rate (!) 24, height 5\' 7"  (1.702 m), weight 106.3 kg, SpO2 (!) 78 %.    Vent Mode: PRVC FiO2 (%):  [100 %] 100 % Set Rate:  [24 bmp] 24 bmp Vt Set:  [320 mL-370 mL] 370 mL PEEP:  [10 cmH20-16 cmH20] 16 cmH20 Plateau Pressure:  [30 cmH20-38 cmH20] 38 cmH20   Intake/Output Summary (Last 24 hours) at 01/27/2020 0817 Last data filed at 01/27/2020 0730 Gross per 24 hour  Intake 2731.12 ml  Output 2325 ml  Net 406.12 ml   Filed Weights   01/25/20 0400 01/26/20 0600 01/27/20 0500  Weight: 103.3 kg 106.3 kg 106.3 kg    Examination:  General: overweight adult female on vent HEENT: ETT, MM pink/moist, PERRL. Oral secretions resembling tube feeds Neuro: Sedated and paralyzed CV: RRR, no MRG heard on posterior auscultation PULM:  Bilateral crackles, worse on R GI: unable to assess due to prone position Extremities: warm/dry, no edema Skin: no rashes  or lesions  Resolved Hospital Problem list    Pneumomediastinum  Assessment & Plan:  Acute hypoxemic/hypercapnic respiratory failure due to COVID ARDS - Mets criteria for moderate ARDS with P/F ratio of 42 Acute respiratory acidosis, improving - PCO2 40-60 with pH 7.37 P/F ratio dropped overnight 9/18 P: Continue ventilator support with lung protective strategies  Permissive hypercapnia, goal pH 7.2 or better Wean PEEP and FiO2 for sats greater than 88%, goal PaO2 55-80 Plateau pressures goal less than 30 cm H20 Driving pressure goal less than 15 Continue Baricitinib and Solu-medrol    Continue Fentanyl and Propofol Continuous NMB resumed 9/27 and Continuous Versed stopped 9/26  Possible aspiration event 9/28 Plan KUB from yesterday showing adequate placement of feeding tube.  Protonix to BID Hold TF, resume when supine.  Follow sputum from 9/28 >  Hypotension: etiology unclear. Certainly heavy sedation is likely playing a role. WBC 4, no fevers. 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 5 units q4.  reduce levemir due to hypoglycemic episodes.   Hypertension -Home medications include Lisinopril/HCTZ P: Holding home antihypertensives.   History of seizures -Home medications include Keppra 250mg  BID  P: Continue home Keppra IV  Seizure precautions    Best practice:  Diet: tube feeding Pain/Anxiety/Delirium protocol (if indicated): as above VAP protocol (if indicated): yes DVT prophylaxis: lovenox  GI prophylaxis: Pantoprazole for stress ulcer prophylaxis Glucose control: SSI Mobility: bed rest Code Status: full Family Communication: Will update mother again today by phone Disposition: remain in  ICU  Labs   CBC: Recent Labs  Lab 01/21/20 0446 01/22/20 0305 01/23/20 0429 01/23/20 0945 01/25/20 0117 01/25/20 0754 01/25/20 1616 01/26/20 0500 01/27/20 0458  WBC 15.7*  --  14.0*  --   --  18.8*  --  14.7* 4.0  NEUTROABS 14.3*  --   --   --   --   --   --   --   --   HGB 11.6*   < > 11.8*   < > 11.9* 11.8* 13.6 12.1 11.5*  HCT 38.5   < > 39.7   < > 35.0* 39.7 40.0 40.9 37.0  MCV 97.0  --  94.7  --   --  94.5  --  96.9 99.5  PLT 174  --  210  --   --  217  --  189 167   < > = values in this interval not displayed.    Basic Metabolic Panel: Recent Labs  Lab 01/22/20 0341 01/22/20 1100 01/23/20 0429 01/23/20 0945 01/25/20 0117 01/25/20 0754 01/25/20 1616 01/26/20 0500 01/27/20 0458  NA 145   < > 144   < > 141 143 143 143 137  K 4.3   < > 4.3   < > 4.5 4.4 3.0* 3.7 3.7  CL 103  --  101  --   --  105  --  100 99  CO2 34*  --  33*  --   --  33*  --  32 30  GLUCOSE 161*  --  167*  --   --  155*  --  123* 102*  BUN 32*  --  39*  --   --  37*  --  39* 30*  CREATININE 0.51  --  0.61  --   --  0.64  --  0.68 0.74  CALCIUM 8.8*  --  8.8*  --   --  8.9  --  8.6* 7.5*  MG 2.1  --  2.2  --   --   --   --   --  1.7  PHOS 4.2  --   --   --   --   --   --   --   --    < > = values in this interval not displayed.   GFR: Estimated Creatinine Clearance: 112.6 mL/min (by C-G formula based on SCr of 0.74 mg/dL). Recent Labs  Lab 01/23/20 0429 01/25/20 0754 01/26/20 0500 01/27/20 0458  WBC 14.0* 18.8* 14.7* 4.0    Liver Function Tests: Recent Labs  Lab 01/22/20 0341 01/25/20 0754  AST 17 22  ALT 31 36  ALKPHOS 44 53  BILITOT 0.7 0.6  PROT 5.8* 6.1*  ALBUMIN 2.0* 2.3*   No results for input(s): LIPASE, AMYLASE in the last 168 hours. No results for input(s): AMMONIA in the last 168 hours.  ABG    Component Value Date/Time   PHART 7.379 01/25/2020 1616   PCO2ART 59.2 (H) 01/25/2020 1616   PO2ART 42 (L) 01/25/2020 1616   HCO3 35.1 (H) 01/25/2020 1616   TCO2 37  (H) 01/25/2020 1616   ACIDBASEDEF 1.0 01/18/2020 1338   O2SAT 76.0 01/25/2020 1616  Coagulation Profile: No results for input(s): INR, PROTIME in the last 168 hours.  Cardiac Enzymes: No results for input(s): CKTOTAL, CKMB, CKMBINDEX, TROPONINI in the last 168 hours.  HbA1C: Hgb A1c MFr Bld  Date/Time Value Ref Range Status  01/24/2020 10:06 PM 6.2 (H) 4.8 - 5.6 % Final    Comment:    (NOTE) Pre diabetes:          5.7%-6.4%  Diabetes:              >6.4%  Glycemic control for   <7.0% adults with diabetes     CBG: Recent Labs  Lab 01/26/20 1532 01/26/20 2000 01/27/20 0000 01/27/20 0355 01/27/20 0805  GLUCAP 113* 169* 144* 110* 76    Critical care time 35 minutes  Joneen Roach, AGACNP-BC [facility]  See Amion for personal pager PCCM on call pager [phone]  01/27/2020 8:47 AM

## 2020-01-27 NOTE — Progress Notes (Signed)
Critical ABG values given to Dr.Desai.  RR changed to 35 per Dr. Humphrey Rolls request.

## 2020-01-27 NOTE — Progress Notes (Addendum)
Patient's head and arms repositioned at this time. Patient desat to 80% without coming back up. Recruitment maneuver preformed; patient did not tolerate. Patient's head turned back to the other side.

## 2020-01-27 NOTE — Progress Notes (Signed)
Patient's head and arms repositioned without complications.  

## 2020-01-27 NOTE — Procedures (Signed)
Arterial Catheter Insertion Procedure Note  Tamie Minteer  [id number]  February 25, 1976  Date:01/27/20  Time:12:22 PM    Provider Performing: Duayne Cal    Procedure: Insertion of Arterial Line (41660) with US guidance (63016)   Indication(s) Blood pressure monitoring and/or need for frequent ABGs  Consent Unable to obtain consent due to emergent nature of procedure.  Anesthesia None   Time Out Verified patient identification, verified procedure, site/side was marked, verified correct patient position, special equipment/implants available, medications/allergies/relevant history reviewed, required imaging and test results available.   Sterile Technique Maximal sterile technique including full sterile barrier drape, hand hygiene, sterile gown, sterile gloves, mask, hair covering, sterile ultrasound probe cover (if used).   Procedure Description Area of catheter insertion was cleaned with chlorhexidine and draped in sterile fashion. With real-time ultrasound guidance an arterial catheter was placed into the right radial artery.  Appropriate arterial tracings confirmed on monitor.     Complications/Tolerance None; patient tolerated the procedure well.   EBL Minimal   Specimen(s) None   Joneen Roach, AGACNP-BC [facility]  See Amion for personal pager PCCM on call pager [phone]  01/27/2020 12:23 PM

## 2020-01-27 NOTE — Progress Notes (Signed)
  Echocardiogram 2D Echocardiogram has been performed.  Jamie Knapp 01/27/2020, 2:46 PM

## 2020-01-27 NOTE — Progress Notes (Signed)
eLink Physician-Brief Progress Note Patient Name: Jamie Knapp DOB: 06-Feb-1976 MRN: [id number]   Date of Service  01/27/2020  HPI/Events of Note  Patient with progressive decline on 3 pressors, and with multiple boluses of sodium bicarbonate, I called her mother Eveny Anastas and discussed the grim prognosis with her, she has decided to make her a DNR and transition to comfort measures, she is on her way to visit with her under the comfort measures / end of life visitation policy.  eICU Interventions  See above note. Comfort measures ordered.        Thomasene Lot Latresa Gasser 01/27/2020, 11:19 PM

## 2020-01-27 NOTE — Progress Notes (Signed)
Patient's turned back to other side due to patient still sat in 70s. PEEP increased to 16 with sats still in 70s.

## 2020-01-27 NOTE — Progress Notes (Addendum)
eLink Physician-Brief Progress Note Patient Name: Jamie Knapp DOB: 1976-01-18 MRN: [id number]   Date of Service  01/27/2020  HPI/Events of Note  Hypoxia - Sat dropped into 73% after patient proned with R side dependent. Review of CXR reveals R lung to be more diseased than the L. Nursing staff says that the patient has bilateral breath sounds.   eICU Interventions  Plan: 1. Reposition patient with L lund dependent.  2. Increase PEEP to 16. 3. Portable CXR STAT. 4. Will ask ground team to evaluate the patient at bedside.      Intervention Category Major Interventions: Hypoxemia - evaluation and management  Nayelly Laughman Eugene 01/27/2020, 4:06 AM

## 2020-01-27 NOTE — Progress Notes (Signed)
Patient placed in supine position by RT x 2 and RN x 3 without complications.  ETT re-secured with a commercial tube holder 25 L.

## 2020-01-28 LAB — CULTURE, RESPIRATORY W GRAM STAIN

## 2020-01-28 MED ORDER — LORAZEPAM 2 MG/ML PO CONC: 1.0000 mg | ORAL | Status: DC | PRN

## 2020-01-28 MED ORDER — LORAZEPAM 2 MG/ML IJ SOLN: 1.0000 mg | INTRAMUSCULAR | Status: DC | PRN

## 2020-01-28 MED ORDER — ONDANSETRON HCL 4 MG/2ML IJ SOLN: 4.0000 mg | Freq: Four times a day (QID) | INTRAMUSCULAR | Status: DC | PRN

## 2020-01-28 MED ORDER — LORAZEPAM 1 MG PO TABS: 1.0000 mg | ORAL_TABLET | ORAL | Status: DC | PRN

## 2020-01-28 MED ORDER — ACETAMINOPHEN 650 MG RE SUPP: 650.0000 mg | Freq: Four times a day (QID) | RECTAL | Status: DC | PRN

## 2020-01-28 MED ORDER — FENTANYL 2500MCG IN NS 250ML (10MCG/ML) PREMIX INFUSION: 0.0000 ug/h | INTRAVENOUS | Status: DC

## 2020-01-28 MED ORDER — PROPOFOL 1000 MG/100ML IV EMUL: 5.0000 ug/kg/min | INTRAVENOUS | Status: AC

## 2020-01-28 MED ORDER — ACETAMINOPHEN 325 MG PO TABS: 650.0000 mg | ORAL_TABLET | Freq: Four times a day (QID) | ORAL | Status: DC | PRN

## 2020-01-28 MED ORDER — HALOPERIDOL LACTATE 5 MG/ML IJ SOLN: 0.5000 mg | INTRAMUSCULAR | Status: DC | PRN

## 2020-01-28 MED ORDER — GLYCOPYRROLATE 1 MG PO TABS: 1.0000 mg | ORAL_TABLET | ORAL | Status: DC | PRN

## 2020-01-28 MED ORDER — GLYCOPYRROLATE 0.2 MG/ML IJ SOLN: 0.2000 mg | INTRAMUSCULAR | Status: DC | PRN

## 2020-01-28 MED ORDER — ONDANSETRON 4 MG PO TBDP: 4.0000 mg | ORAL_TABLET | Freq: Four times a day (QID) | ORAL | Status: DC | PRN

## 2020-01-28 MED ORDER — HALOPERIDOL 0.5 MG PO TABS: 0.5000 mg | ORAL_TABLET | ORAL | Status: DC | PRN

## 2020-01-28 MED ORDER — HALOPERIDOL LACTATE 2 MG/ML PO CONC: 0.5000 mg | ORAL | Status: DC | PRN

## 2020-01-28 MED ORDER — BIOTENE DRY MOUTH MT LIQD: 15.0000 mL | OROMUCOSAL | Status: DC | PRN

## 2020-01-28 MED ORDER — POLYVINYL ALCOHOL 1.4 % OP SOLN: 1.0000 [drp] | Freq: Four times a day (QID) | OPHTHALMIC | Status: DC | PRN

## 2020-01-29 NOTE — Death Summary Note (Signed)
DEATH SUMMARY   Patient Details  Name: Jamie Knapp MRN: [id number] DOB: 1975-05-30  Admission/Discharge Information   Admit Date:  January 19, 2020  Date of Death: Date of Death: 2020/02/07  Time of Death: Time of Death: 0148  Length of Stay: 08-27-2022  Referring Physician: Gweneth Dimitri, MD   Reason(s) for Hospitalization  COVID 19 Pneumonia  Diagnoses  Preliminary cause of death:  Secondary Diagnoses (including complications and co-morbidities):  Principal Problem:   COVID-19 Active Problems:   Acute respiratory failure with hypoxia (HCC)   Thrombocytopenia (HCC)   Hypokalemia   SIRS (systemic inflammatory response syndrome) (HCC)   Essential hypertension   Seizures (HCC)   Obesity (BMI 30-39.9)   Borderline type 2 diabetes mellitus   Hyperglycemia   Prolonged QT interval   Diarrhea   Endotracheal tube present   Pressure injury of skin   Hypoxia   Brief Hospital Course (including significant findings, care, treatment, and services provided and events leading to death)  Jamie Knapp was a 44 y.o. year old female with a medical history significant for hypertension and seizures who presented to the [facility] emergency department due to shortness of breath that started earlier in the day on 01/19/2020.  Patient complained of 1 week onset of nonproductive cough, fever, body aches and diarrhea, she got tested for Covid on Sunday (01/03/2020) and was positive. She had 1 dose of Pfizer vaccine already (unsure what date.)  Initially admitted to progressive care unit treated with steroids, remdesivir, monoclonal antibody.  She was maintained on nasal cannula initially and progressed over the course of the next week to high flow oxygen.  Her chest x-ray showed multifocal pneumonia.  On 9/17 she required intubation and was transferred to [facility] for further care.  She additionally received empiric doses of antibiotics doxycycline, ceftriaxone and cefepime.  Her hospital  course was complicated by severe ARDS requiring neuromuscular became prone positioning.  She had pneumomediastinum, as well as steroid-induced hyperglycemia.  Unfortunately on the evening of 9/29 she continued to decline despite maximum medical therapy.  Her mother was notified and the decision was made to transition to comfort measures.  She was compassionately extubated and died on 2020/02/07.   Pertinent Labs and Studies  Significant Diagnostic Studies DG Abd 1 View  Result Date: 01/27/2020 CLINICAL DATA:  Aspiration pneumonia, feeding tube EXAM: ABDOMEN - 1 VIEW COMPARISON:  01/26/2020 FINDINGS: Esophageal tube tip overlies the proximal duodenum. Visible gas pattern is nonobstructed. IMPRESSION: Esophageal tube tip overlies the proximal duodenum. Electronically Signed   By: Jasmine Pang M.D.   On: 01/27/2020 17:02   DG Abd 1 View  Result Date: 01/26/2020 CLINICAL DATA:  Respiratory failure.  Vomiting. EXAM: ABDOMEN - 1 VIEW COMPARISON:  Abdomen 11/16/2014. FINDINGS: Feeding tube noted with tip over distal stomach. No bowel distention. No free air identified. Pelvic calcifications consistent with phleboliths. Radiopacity noted over the lower most portion of the pelvis. This is of questionable etiology. No acute bony abnormality. IMPRESSION: Feeding tube noted with tip over the distal stomach. No bowel distention or free air. Feeding tube noted with tip over the distal stomach. Electronically Signed   By: Maisie Fus  Register   On: 01/26/2020 14:46   DG CHEST PORT 1 VIEW  Result Date: 01/27/2020 CLINICAL DATA:  Pneumonia, aspiration EXAM: PORTABLE CHEST 1 VIEW COMPARISON:  01/27/2020 FINDINGS: Endotracheal tube projects low at the level of the carina. Feeding tube extends into the stomach. Dense bilateral lower lobe airspace disease. Some improvement aeration  in the upper lobes. No pneumothorax. IMPRESSION: 1. Endotracheal tube projects low, at the level of the carina. 2. Persistent bilateral dense  lower lobe airspace disease. 3. Some improved aeration in the upper lobes. Electronically Signed   By: Genevive Bi M.D.   On: 01/27/2020 17:07   DG CHEST PORT 1 VIEW  Result Date: 01/27/2020 CLINICAL DATA:  Oxygen desaturation EXAM: PORTABLE CHEST 1 VIEW COMPARISON:  01/26/2020 FINDINGS: Right PICC line, endotracheal tube, and enteric tube are unchanged in position. Multiple gas collections projected over the neck and right shoulder are likely artifactual. Diffuse bilateral pulmonary infiltrates with air bronchograms. Mild progression since previous study. Heart size is obscured. No pleural effusions. No pneumothorax. IMPRESSION: Diffuse bilateral pulmonary infiltrates with air bronchograms, mildly progressed since previous study. Electronically Signed   By: Burman Nieves M.D.   On: 01/27/2020 04:59   DG CHEST PORT 1 VIEW  Result Date: 01/26/2020 CLINICAL DATA:  Respiratory failure. EXAM: PORTABLE CHEST 1 VIEW COMPARISON:  January 24, 2020. FINDINGS: Stable cardiomediastinal silhouette. Endotracheal and feeding tubes are unchanged in position. Right-sided PICC line is unchanged in position. No pneumothorax is noted. Stable bilateral lung opacities are noted consistent with multifocal pneumonia. Probable small right pleural effusion is noted. Bony thorax is unremarkable. IMPRESSION: Stable support apparatus. Stable bilateral lung opacities consistent with multifocal pneumonia. Electronically Signed   By: Lupita Raider M.D.   On: 01/26/2020 14:42   DG CHEST PORT 1 VIEW  Result Date: 01/24/2020 CLINICAL DATA:  COVID-19 positivity, check endotracheal tube placement EXAM: PORTABLE CHEST 1 VIEW COMPARISON:  01/18/2020 FINDINGS: Cardiac shadow is stable in appearance. Endotracheal tube, gastric catheter, left jugular central line and right-sided PICC line are noted in satisfactory position. The PICC line is new from the prior exam in the mid superior vena cava. Diffuse bilateral airspace opacities  are noted slightly increased when compared with the prior exam although a portion of this may be technical in nature. No bony abnormality is noted. IMPRESSION: New right-sided PICC line as described above. Bilateral airspace opacities slightly increased from the prior exam a portion of which may be technical in nature. Electronically Signed   By: Alcide Clever M.D.   On: 01/24/2020 10:54   DG Chest Port 1 View  Result Date: 01/18/2020 CLINICAL DATA:  Acute respiratory distress syndrome with hypoxemia. COVID 19 positive. EXAM: PORTABLE CHEST 1 VIEW COMPARISON:  01/17/2020 FINDINGS: Endotracheal tube in good position. Left jugular central venous catheter tip in the right atrium unchanged. NG tube enters the stomach. Diffuse bilateral airspace disease with interval improvement. No effusion. IMPRESSION: Diffuse bilateral airspace disease with interval improvement. Electronically Signed   By: Marlan Palau M.D.   On: 01/18/2020 14:29   DG Chest Port 1 View  Result Date: 01/17/2020 CLINICAL DATA:  ARDS due to COVID pneumonia EXAM: PORTABLE CHEST 1 VIEW COMPARISON:  01/16/2020 FINDINGS: ETT tip is stable above the carina. There is a left IJ catheter with tip in the right atrium. NG tube tip and side port are well below the level of the GE junction. Stable cardiomediastinal contours. No change in bilateral interstitial and airspace densities. Mild subcutaneous gas within bilateral supraclavicular regions appears unchanged IMPRESSION: 1. Stable support apparatus. 2. No change in aeration to the lungs compared with previous exam. Electronically Signed   By: Signa Kell M.D.   On: 01/17/2020 09:00   DG CHEST PORT 1 VIEW  Result Date: 01/16/2020 CLINICAL DATA:  Hypoxia, intubated, COVID-19 EXAM: PORTABLE CHEST 1 VIEW  COMPARISON:  01/16/2020 FINDINGS: Single frontal view of the chest demonstrates stable endotracheal tube, left internal jugular catheter, and enteric catheter. The cardiac silhouette is stable.  Diffuse interstitial prominence with multifocal airspace disease again identified, slightly progressive at the left lung base. Pneumomediastinum and subcutaneous gas in the supraclavicular regions unchanged, consistent with barotrauma. No evidence of effusion or pneumothorax. No acute bony abnormalities. IMPRESSION: 1. Slight progression of multifocal bilateral COVID-19 pneumonia. 2. Persistent changes of barotrauma, with stable pneumomediastinum and subcutaneous emphysema. Electronically Signed   By: Sharlet Salina M.D.   On: 01/16/2020 23:19   DG Chest Portable 1 View  Addendum Date: 01/16/2020   ADDENDUM REPORT: 01/16/2020 19:01 ADDENDUM: Findings called to Dr. Gwenlyn Perking. Electronically Signed   By: Gerome Sam III M.D   On: 01/16/2020 19:01   Result Date: 01/16/2020 CLINICAL DATA:  Post intubation EXAM: PORTABLE CHEST 1 VIEW COMPARISON:  January 15, 2020 FINDINGS: The ETT is in good position. The NG tube terminates below today's film. There is now air in the subcutaneous tissues at the bases of the neck bilaterally, new in the interval. I suspect pneumomediastinum is well. No pneumothorax. Bilateral pulmonary infiltrates are stable on the right and worsened in the left upper lobe in the interval. IMPRESSION: 1. Suspected pneumomediastinum. 2. New subcutaneous air in the bases of the neck bilaterally. 3. Support apparatus as above. 4. Stable opacity on the right and increased left upper lobe opacity in the interval. Findings will be called to the referring physician. Electronically Signed: By: Gerome Sam III M.D On: 01/16/2020 12:20   DG CHEST PORT 1 VIEW  Result Date: 01/16/2020 CLINICAL DATA:  Central line placement EXAM: PORTABLE CHEST 1 VIEW COMPARISON:  Chest radiograph from earlier today. FINDINGS: Endotracheal tube tip is 3.3 cm above the carina. Enteric tube enters stomach with the tip not seen on this image. Left internal jugular central venous catheter terminates at the cavoatrial  junction. Stable cardiomediastinal silhouette with normal heart size. No pneumothorax. No pleural effusion. Similar subcutaneous emphysema in the bilateral lower neck and upper chest. No pulmonary edema. Hazy opacity in left lower lung is unchanged. IMPRESSION: 1. Well-positioned support structures as detailed. No pneumothorax. 2. Subcutaneous emphysema in the lower neck and upper chest bilaterally, obscuring the lungs. 3. Stable hazy opacity in the left lower lung, favor atelectasis. Electronically Signed   By: Delbert Phenix M.D.   On: 01/16/2020 18:20   DG CHEST PORT 1 VIEW  Result Date: 01/15/2020 CLINICAL DATA:  COVID-19 infection.  NG tube placement EXAM: PORTABLE CHEST 1 VIEW COMPARISON:  January 15, 2020 FINDINGS: The ETT is in good position. The cardiomediastinal silhouette is stable. No pneumothorax. Pulmonary opacities, left greater than right, have improved on today's study. The NG tube terminates in the stomach, in good position. IMPRESSION: Support apparatus as above. Pulmonary opacities are less prominent on today's study. Electronically Signed   By: Gerome Sam III M.D   On: 01/15/2020 22:13   Portable Chest x-ray  Result Date: 01/15/2020 CLINICAL DATA:  COVID-19 positive. Code blue. ET and OG tube placement EXAM: PORTABLE CHEST 1 VIEW COMPARISON:  January 15, 2020 FINDINGS: An ET tube is been placed in the interval in terminates in good position. The distal tip of the NG tube is in the upper left abdomen, within the body of the stomach. The side port is probably just below the GE junction. Diffuse bilateral pulmonary infiltrates, left greater than right, persist. No other interval changes. No pneumothorax. IMPRESSION: 1. ETT  placement as above, in good position. 2. The side port of the NG tube appears to terminate just below the GE junction. The patient may benefit from advancing several cm. 3. Persistent diffuse bilateral pulmonary infiltrates, left greater than right. Electronically  Signed   By: Gerome Sam III M.D   On: 01/15/2020 18:13   DG CHEST PORT 1 VIEW  Result Date: 01/15/2020 CLINICAL DATA:  Hypoxia, COVID EXAM: PORTABLE CHEST 1 VIEW COMPARISON:  February 08, 2020 FINDINGS: Diffuse bilateral airspace disease is worsening since prior study. Cardiomegaly. Possible small bilateral effusions. No pneumothorax. No acute bony abnormality. IMPRESSION: Worsening diffuse bilateral airspace disease. Possible small effusions. Electronically Signed   By: Charlett Nose M.D.   On: 01/15/2020 05:05   DG Chest Port 1 View  Result Date: 02-08-20 CLINICAL DATA:  44 year old with cough, shortness of breath and hypoxia. COVID-19 positive. EXAM: PORTABLE CHEST 1 VIEW COMPARISON:  None. FINDINGS: Cardiac silhouette normal in size for AP portable technique. Patchy and confluent airspace opacities throughout both lungs. Pulmonary vascularity normal. No pleural effusions. IMPRESSION: Acute multilobar pneumonia involving both lungs. Electronically Signed   By: Hulan Saas M.D.   On: 02-08-20 18:55   [place] RT UPPER EXTREM LTD SOFT TISSUE NON VASCULAR  Result Date: 01/21/2020 CLINICAL DATA:  Right upper extremity redness and swelling EXAM: ULTRASOUND RIGHT UPPER EXTREMITY LIMITED TECHNIQUE: Ultrasound examination of the upper extremity soft tissues was performed in the area of clinical concern. COMPARISON:  None. FINDINGS: Targeted ultrasound was performed of the soft tissues of the right antecubital fossa at site of patient's clinical concern. No soft tissue edema or fluid collection. No hyperemia is evident. There is echogenic thrombus visualized within a superficial vascular structure within the right antecubital fossa suggesting superficial thrombophlebitis. IMPRESSION: 1. Findings suggestive of superficial thrombophlebitis within the right antecubital fossa. A dedicated vascular ultrasound has been ordered according to the technologists note. 2. No soft tissue fluid collection or abscess at site  of clinical concern. Electronically Signed   By: Duanne Guess D.O.   On: 01/21/2020 10:08   ECHOCARDIOGRAM LIMITED  Result Date: 01/27/2020    ECHOCARDIOGRAM LIMITED REPORT   Patient Name:   VIRIGINIA AMENDOLA Date of Exam: 01/27/2020 Medical Rec #:  [id number]             Height:       67.0 in Accession #:    [id number]            Weight:       234.3 lb Date of Birth:  August 02, 1975             BSA:          2.163 m Patient Age:    44 years              BP:           94/41 mmHg Patient Gender: F                     HR:           121 bpm. Exam Location:  Inpatient Procedure: Limited Echo, Limited Color Doppler and Cardiac Doppler Indications:    Shock (HCC) [[id number]]  History:        Patient has no prior history of Echocardiogram examinations.                 Risk Factors:Hypertension, Diabetes and Non-Smoker.  Sonographer:    Renella Cunas RDCS Referring Phys: [phone] Clarene Critchley  HOFFMAN  Sonographer Comments: Echo performed with patient supine and on artificial respirator. Covid positive. IMPRESSIONS  1. Left ventricular ejection fraction, by estimation, is 60 to 65%. The left ventricle has normal function. The left ventricle has no regional wall motion abnormalities. Left ventricular diastolic parameters are consistent with Grade I diastolic dysfunction (impaired relaxation).  2. Right ventricular systolic function is normal. The right ventricular size is normal. There is mildly elevated pulmonary artery systolic pressure.  3. The mitral valve is normal in structure. No evidence of mitral valve regurgitation. No evidence of mitral stenosis.  4. The aortic valve is normal in structure. Aortic valve regurgitation is not visualized. No aortic stenosis is present.  5. The inferior vena cava is normal in size with greater than 50% respiratory variability, suggesting right atrial pressure of 3 mmHg. FINDINGS  Left Ventricle: Left ventricular ejection fraction, by estimation, is 60 to 65%. The left ventricle has normal  function. The left ventricle has no regional wall motion abnormalities. The left ventricular internal cavity size was normal in size. There is  no left ventricular hypertrophy. Left ventricular diastolic parameters are consistent with Grade I diastolic dysfunction (impaired relaxation). Right Ventricle: The right ventricular size is normal. No increase in right ventricular wall thickness. Right ventricular systolic function is normal. There is mildly elevated pulmonary artery systolic pressure. The tricuspid regurgitant velocity is 2.87  m/s, and with an assumed right atrial pressure of 8 mmHg, the estimated right ventricular systolic pressure is 40.9 mmHg. Left Atrium: Left atrial size was normal in size. Right Atrium: Right atrial size was normal in size. Pericardium: There is no evidence of pericardial effusion. Mitral Valve: The mitral valve is normal in structure. No evidence of mitral valve stenosis. Tricuspid Valve: The tricuspid valve is normal in structure. Tricuspid valve regurgitation is not demonstrated. No evidence of tricuspid stenosis. Aortic Valve: The aortic valve is normal in structure. Aortic valve regurgitation is not visualized. No aortic stenosis is present. Pulmonic Valve: The pulmonic valve was normal in structure. Pulmonic valve regurgitation is not visualized. No evidence of pulmonic stenosis. Aorta: The aortic root is normal in size and structure. Venous: The inferior vena cava is normal in size with greater than 50% respiratory variability, suggesting right atrial pressure of 3 mmHg. IAS/Shunts: No atrial level shunt detected by color flow Doppler. LEFT VENTRICLE PLAX 2D LVOT diam:     1.80 cm     Diastology LV SV:         55          LV e' medial:    6.86 cm/s LV SV Index:   25          LV E/e' medial:  12.4 LVOT Area:     2.54 cm    LV e' lateral:   6.57 cm/s                            LV E/e' lateral: 13.0  LV Volumes (MOD) LV vol d, MOD A2C: 72.5 ml LV vol d, MOD A4C: 87.1 ml LV vol  s, MOD A2C: 32.0 ml LV vol s, MOD A4C: 40.7 ml LV SV MOD A2C:     40.5 ml LV SV MOD A4C:     87.1 ml LV SV MOD BP:      45.9 ml RIGHT VENTRICLE RV S prime:     19.60 cm/s TAPSE (M-mode): 2.0 cm AORTIC VALVE LVOT Vmax:   169.00  cm/s LVOT Vmean:  105.000 cm/s LVOT VTI:    0.216 m  AORTA Ao Root diam: 2.80 cm MITRAL VALVE               TRICUSPID VALVE MV Area (PHT): 3.48 cm    TR Peak grad:   32.9 mmHg MV Decel Time: 218 msec    TR Vmax:        287.00 cm/s MV E velocity: 85.30 cm/s MV A velocity: 96.00 cm/s  SHUNTS MV E/A ratio:  0.89        Systemic VTI:  0.22 m                            Systemic Diam: 1.80 cm Donato Schultz MD Electronically signed by Donato Schultz MD Signature Date/Time: 01/27/2020/3:18:54 PM    Final    [place] EKG SITE RITE  Result Date: 01/20/2020 If Site Rite image not attached, placement could not be confirmed due to current cardiac rhythm.   Microbiology Recent Results (from the past 240 hour(s))  Culture, respiratory (non-expectorated)     Status: None   Collection Time: 01/26/20 12:07 PM   Specimen: Tracheal Aspirate; Respiratory  Result Value Ref Range Status   Specimen Description TRACHEAL ASPIRATE  Final   Special Requests NONE  Final   Gram Stain   Final    RARE WBC PRESENT,BOTH PMN AND MONONUCLEAR MODERATE GRAM POSITIVE COCCI Performed at [facility], [address], [place], [place] [address]    Culture ABUNDANT STAPHYLOCOCCUS AUREUS  Final   Report Status 12/31/2019 FINAL  Final   Organism ID, Bacteria STAPHYLOCOCCUS AUREUS  Final      Susceptibility   Staphylococcus aureus - MIC*    CIPROFLOXACIN <=0.5 SENSITIVE Sensitive     ERYTHROMYCIN <=0.25 SENSITIVE Sensitive     GENTAMICIN <=0.5 SENSITIVE Sensitive     OXACILLIN <=0.25 SENSITIVE Sensitive     TETRACYCLINE <=1 SENSITIVE Sensitive     VANCOMYCIN 1 SENSITIVE Sensitive     TRIMETH/SULFA <=10 SENSITIVE Sensitive     CLINDAMYCIN <=0.25 SENSITIVE Sensitive     RIFAMPIN <=0.5 SENSITIVE Sensitive      Inducible Clindamycin NEGATIVE Sensitive     * ABUNDANT STAPHYLOCOCCUS AUREUS    Lab Basic Metabolic Panel: Recent Labs  Lab 01/22/20 0341 01/22/20 1100 01/23/20 0429 01/23/20 0945 01/25/20 0117 01/25/20 0754 01/25/20 1616 01/26/20 0500 01/27/20 0458  NA 145   < > 144   < > 141 143 143 143 137  K 4.3   < > 4.3   < > 4.5 4.4 3.0* 3.7 3.7  CL 103  --  101  --   --  105  --  100 99  CO2 34*  --  33*  --   --  33*  --  32 30  GLUCOSE 161*  --  167*  --   --  155*  --  123* 102*  BUN 32*  --  39*  --   --  37*  --  39* 30*  CREATININE 0.51  --  0.61  --   --  0.64  --  0.68 0.74  CALCIUM 8.8*  --  8.8*  --   --  8.9  --  8.6* 7.5*  MG 2.1  --  2.2  --   --   --   --   --  1.7  PHOS 4.2  --   --   --   --   --   --   --   --    < > =  values in this interval not displayed.   Liver Function Tests: Recent Labs  Lab 01/22/20 0341 01/25/20 0754  AST 17 22  ALT 31 36  ALKPHOS 44 53  BILITOT 0.7 0.6  PROT 5.8* 6.1*  ALBUMIN 2.0* 2.3*   No results for input(s): LIPASE, AMYLASE in the last 168 hours. No results for input(s): AMMONIA in the last 168 hours. CBC: Recent Labs  Lab 01/23/20 0429 01/23/20 0945 01/25/20 0117 01/25/20 0754 01/25/20 1616 01/26/20 0500 01/27/20 0458  WBC 14.0*  --   --  18.8*  --  14.7* 4.0  HGB 11.8*   < > 11.9* 11.8* 13.6 12.1 11.5*  HCT 39.7   < > 35.0* 39.7 40.0 40.9 37.0  MCV 94.7  --   --  94.5  --  96.9 99.5  PLT 210  --   --  217  --  189 167   < > = values in this interval not displayed.   Cardiac Enzymes: No results for input(s): CKTOTAL, CKMB, CKMBINDEX, TROPONINI in the last 168 hours. Sepsis Labs: Recent Labs  Lab 01/23/20 0429 01/25/20 0754 01/26/20 0500 01/27/20 0458  WBC 14.0* 18.8* 14.7* 4.0    Charlott Hollerikita S Tyronica [place] 12-Oct-2019, 12:47 PM

## 2020-01-29 NOTE — Procedures (Signed)
Extubation Procedure Note  Patient Details:   Name: Almira Phetteplace DOB: 06-22-1975 MRN: [id number]   Airway Documentation:  Airway 7.5 mm (Active)  Secured at (cm) 25 cm 01/27/20 2026  Measured From Lips 01/27/20 2026  Secured Location Center 01/27/20 2026  Secured By [employer] 01/27/20 2026  Tube Holder Repositioned Yes 01/27/20 2026  Cuff Pressure (cm H2O) 26 cm H2O 01/27/20 0742  Site Condition Dry 01/27/20 2026  Date Prophylactic Dressing Applied (if applicable) 01/18/20 01/18/20 1545   Vent end date: (not recorded) Vent end time: (not recorded)   Evaluation  O2 sats: stable throughout Complications: No apparent complications Patient did tolerate procedure well. Bilateral Breath Sounds: Diminished   No  Pt. Extubated to rm air per Dr. Warrick Parisian.    Milta Deiters 01/26/2020, 1:46 AM

## 2020-01-29 NOTE — Progress Notes (Signed)
eLink Physician-Brief Progress Note Patient Name: Jamie Knapp DOB: 01-06-1976 MRN: [id number]   Date of Service  02/07/2020  HPI/Events of Note  Patient transitioning to comfort care with compassionate extubation.   eICU Interventions  Plan discussed with bedside RN is to discontinue Vecuronium infusion while continuing Propofol infusion for a minimum of one hour afterward prior to discontinuation.        Thomasene Lot Gaberiel Youngblood February 07, 2020, 12:43 AM

## 2020-01-29 DEATH — deceased

## 2021-04-27 IMAGING — DX DG CHEST 1V PORT
1 series · 1 of 1 positions shown · non-contrast
Comparison: 01/18/2020

CLINICAL DATA: TWEP6-MW positivity, check endotracheal tube
placement

EXAM:
PORTABLE CHEST 1 VIEW

[chest]
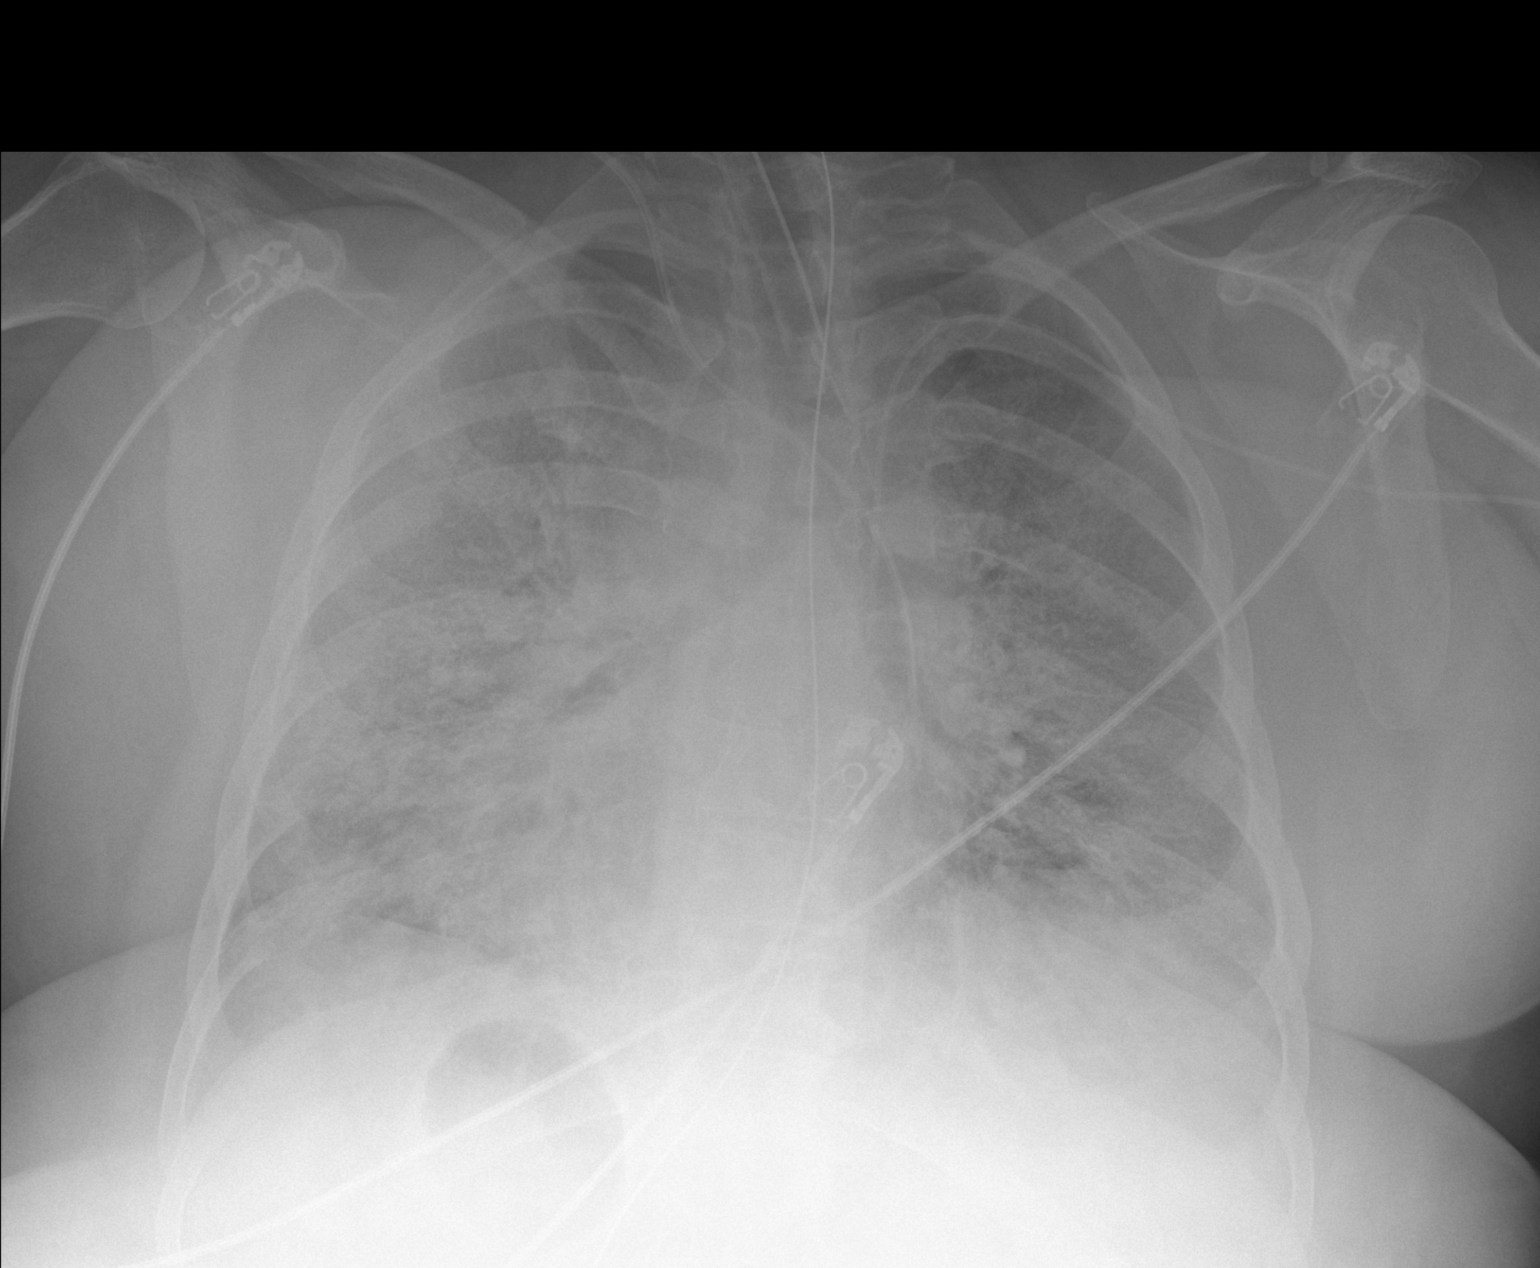

[1 of 1 positions shown; findings below may reference images not displayed]

FINDINGS: Cardiac shadow is stable in appearance. Endotracheal tube, gastric
catheter, left jugular central line and right-sided PICC line are
noted in satisfactory position. The PICC line is new from the prior
exam in the mid superior vena cava. Diffuse bilateral airspace
opacities are noted slightly increased when compared with the prior
exam although a portion of this may be technical in nature. No bony
abnormality is noted.
IMPRESSION: New right-sided PICC line as described above.

Bilateral airspace opacities slightly increased from the prior exam
a portion of which may be technical in nature.

## 2021-04-29 IMAGING — DX DG CHEST 1V PORT
1 series · 1 of 1 positions shown · non-contrast
Comparison: January 24, 2020.

CLINICAL DATA: Respiratory failure.

EXAM:
PORTABLE CHEST 1 VIEW

[chest]
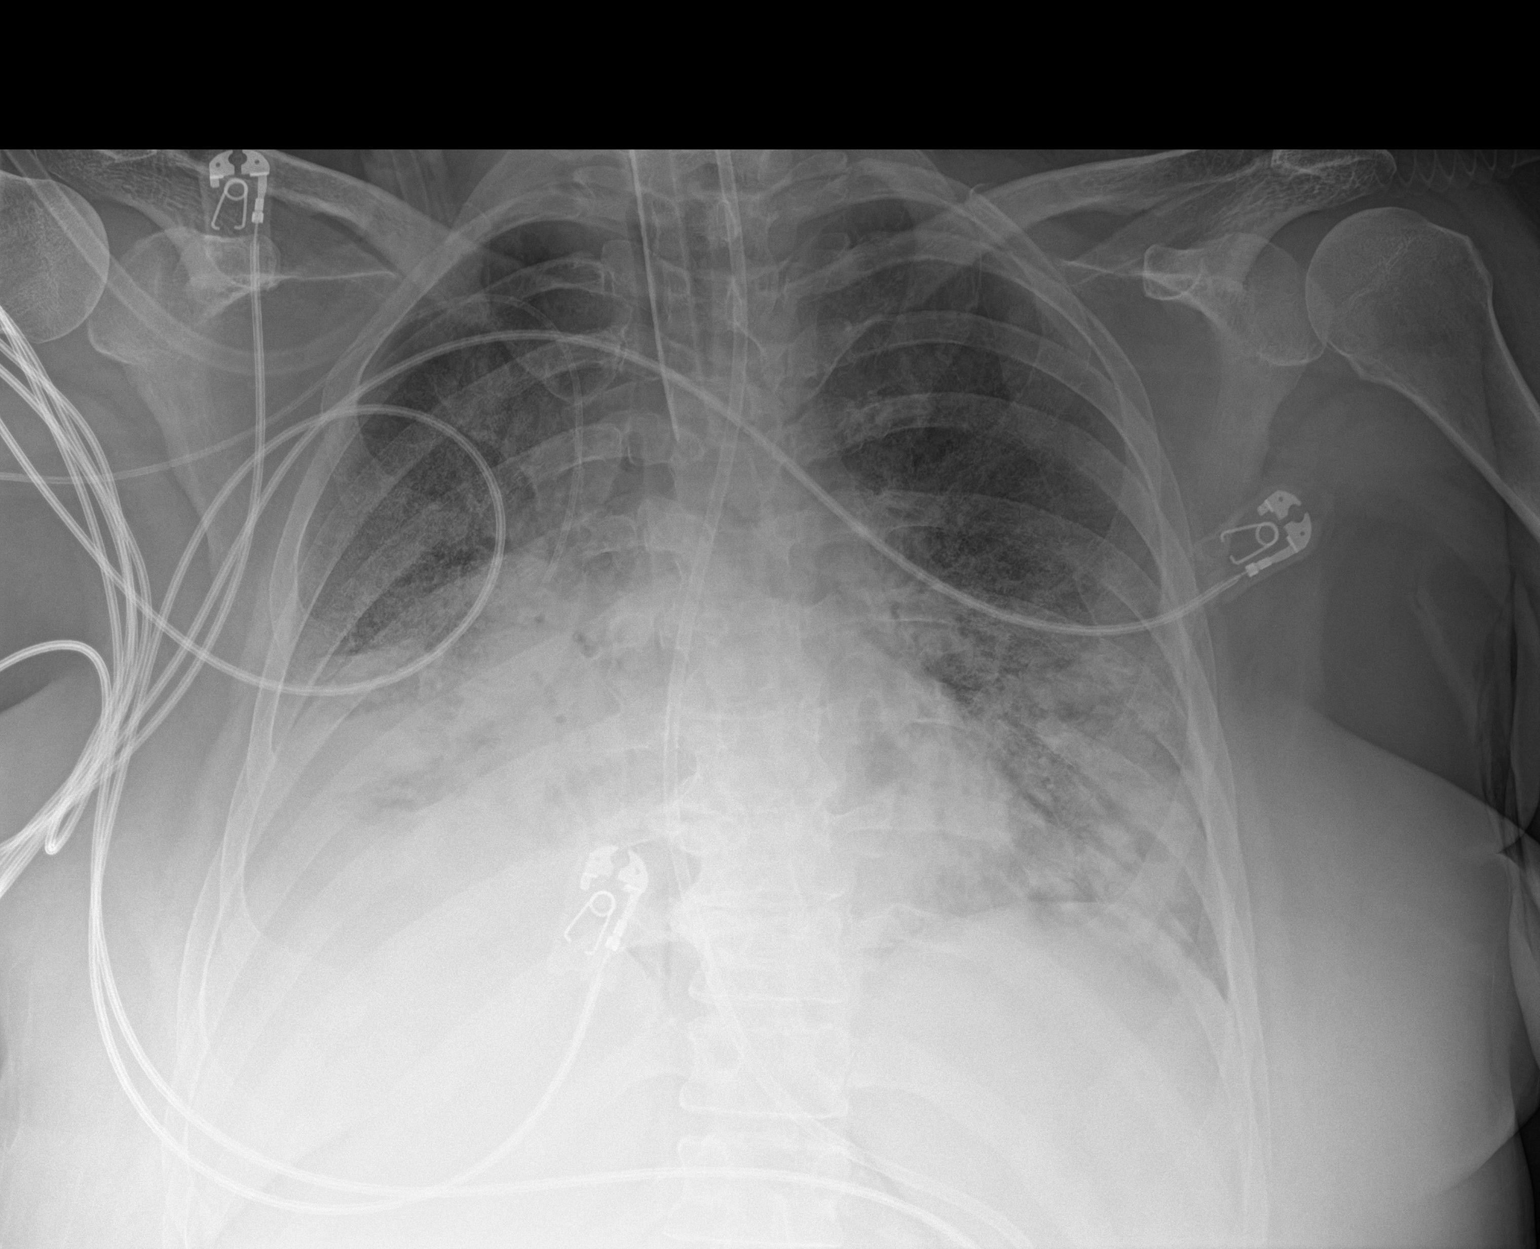

[1 of 1 positions shown; findings below may reference images not displayed]

FINDINGS: Stable cardiomediastinal silhouette. Endotracheal and feeding tubes
are unchanged in position. Right-sided PICC line is unchanged in
position. No pneumothorax is noted. Stable bilateral lung opacities
are noted consistent with multifocal pneumonia. Probable small right
pleural effusion is noted. Bony thorax is unremarkable.
IMPRESSION: Stable support apparatus. Stable bilateral lung opacities consistent
with multifocal pneumonia.

## 2021-04-29 IMAGING — DX DG ABDOMEN 1V
1 series · 2 of 2 positions shown · non-contrast
Comparison: Abdomen 11/16/2014.

CLINICAL DATA: Respiratory failure.  Vomiting.

EXAM:
ABDOMEN - 1 VIEW

[Series 1: abdomen · 0.14mm/px · 2 of 2 slices shown]
[im 1/2]
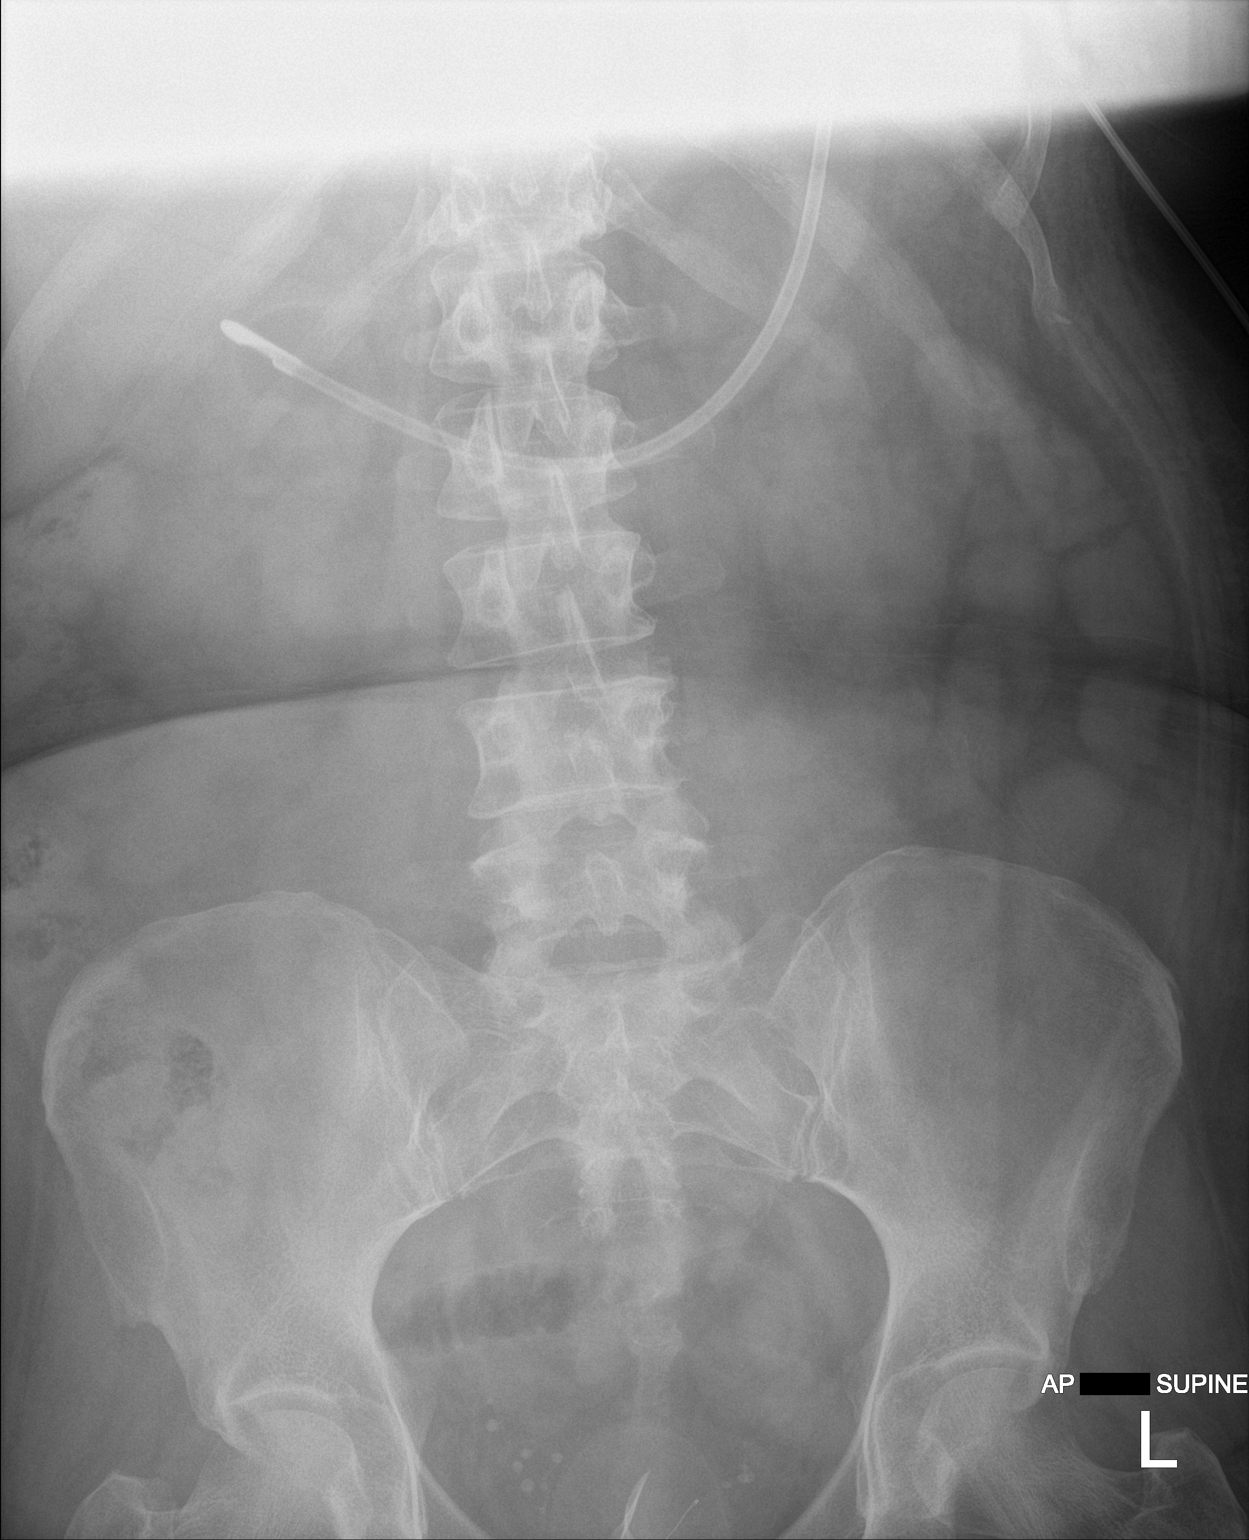
[im 2/2]
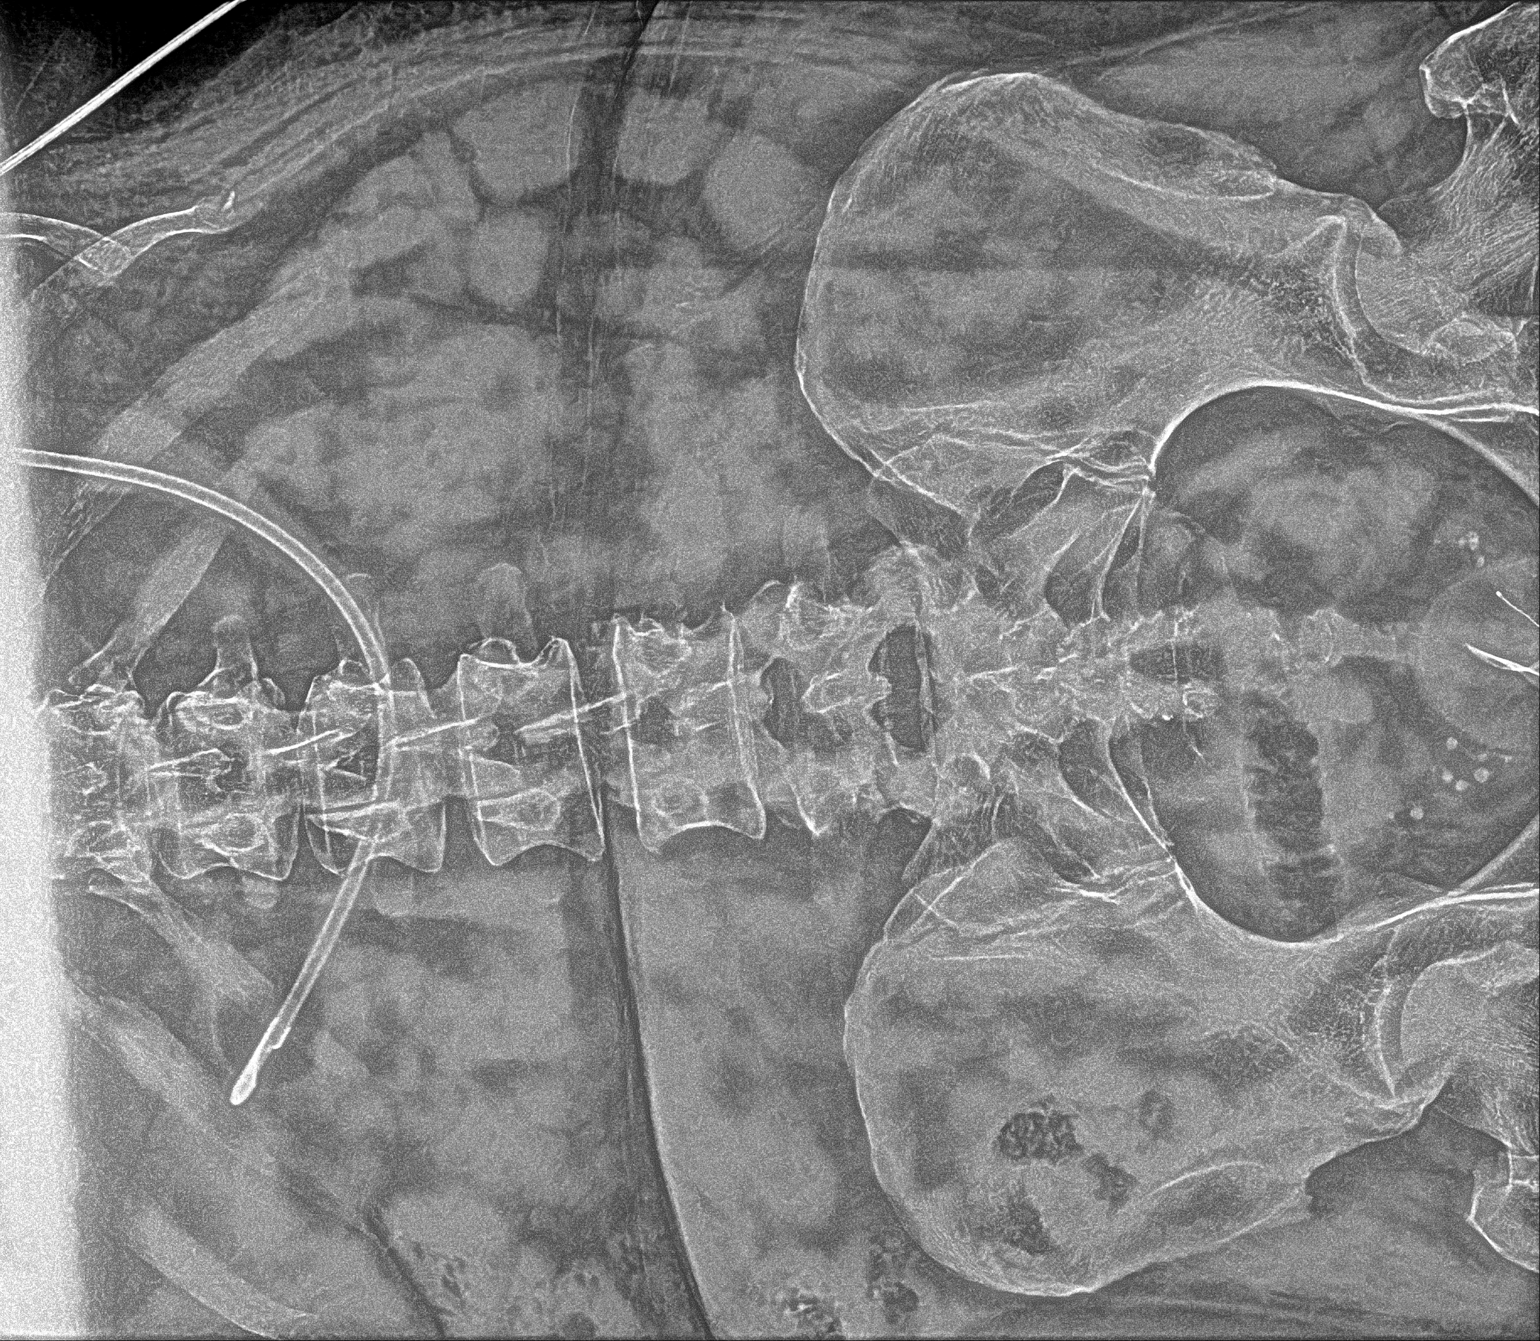

[2 of 2 positions shown; findings below may reference images not displayed]

FINDINGS: Feeding tube noted with tip over distal stomach. No bowel
distention. No free air identified. Pelvic calcifications consistent
with phleboliths. Radiopacity noted over the lower most portion of
the pelvis. This is of questionable etiology. No acute bony
abnormality.
IMPRESSION: Feeding tube noted with tip over the distal stomach. No bowel
distention or free air. Feeding tube noted with tip over the distal
stomach.
